# Patient Record
Sex: Male | Born: 1970
Health system: Southern US, Community
[De-identification: ages and names within clinical notes are randomized; demographics above are authoritative.]

## PROBLEM LIST (undated history)

## (undated) DIAGNOSIS — F419 Anxiety disorder, unspecified: Secondary | ICD-10-CM

## (undated) DIAGNOSIS — G43909 Migraine, unspecified, not intractable, without status migrainosus: Secondary | ICD-10-CM

## (undated) DIAGNOSIS — I10 Essential (primary) hypertension: Secondary | ICD-10-CM

## (undated) HISTORY — PX: OTHER SURGICAL HISTORY: SHX169

## (undated) HISTORY — DX: Migraine, unspecified, not intractable, without status migrainosus: G43.909

## (undated) HISTORY — PX: TONSILLECTOMY: SUR1361

## (undated) HISTORY — DX: Essential (primary) hypertension: I10

## (undated) HISTORY — DX: Anxiety disorder, unspecified: F41.9

---

## 1999-12-06 HISTORY — PX: FRACTURE SURGERY: SHX138

## 2007-08-09 ENCOUNTER — Emergency Department: Payer: Self-pay | Admitting: Emergency Medicine

## 2010-03-02 ENCOUNTER — Emergency Department: Payer: Self-pay | Admitting: Emergency Medicine

## 2010-10-01 IMAGING — CT CT HEAD WITHOUT CONTRAST
1 series · 16 of 28 positions shown, 20 images · non-contrast
Comparison: none

REASON FOR EXAM: headache, elevated BP, dizziness
COMMENTS:

[Series 2: soft tissue · axial · 0.39mm/px · z∈[-138,-12]mm · 16 of 28 slices shown, 20 images]
[im 2/28  brain]
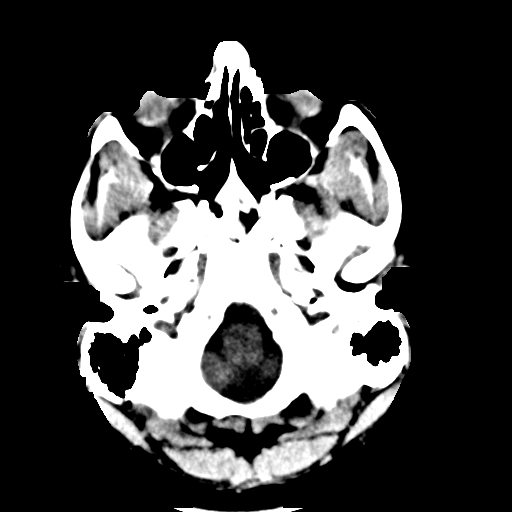
[im 2/28  bone]
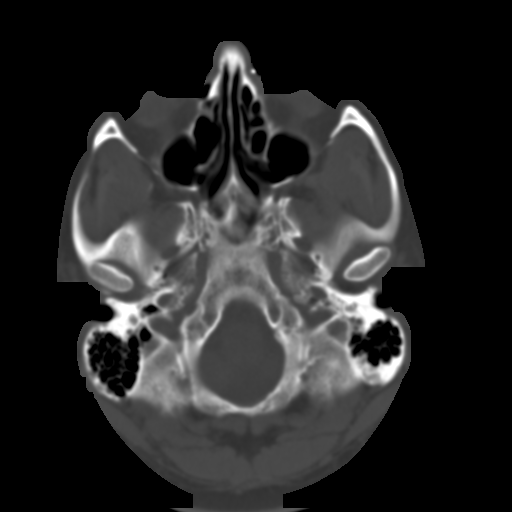
[im 4/28  brain]
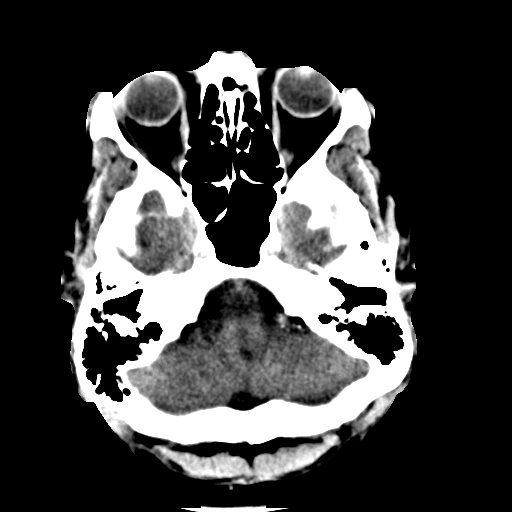
[im 6/28  brain]
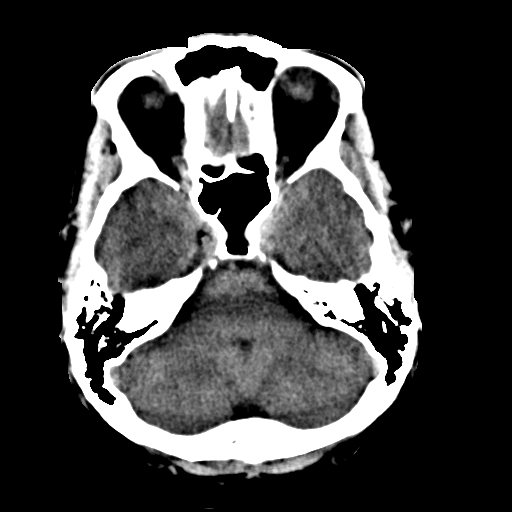
[im 7/28  brain]
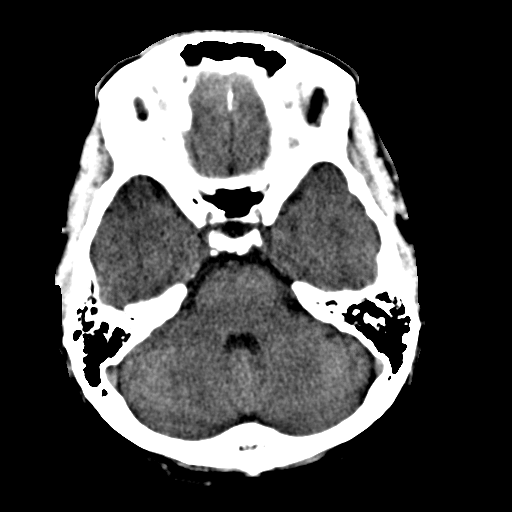
[im 9/28  brain]
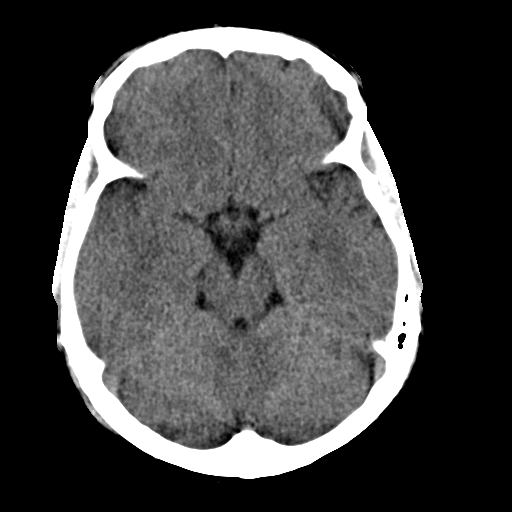
[im 9/28  bone]
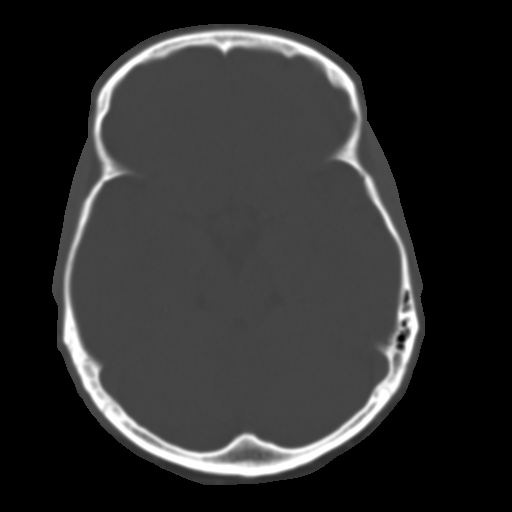
[im 10/28  brain]
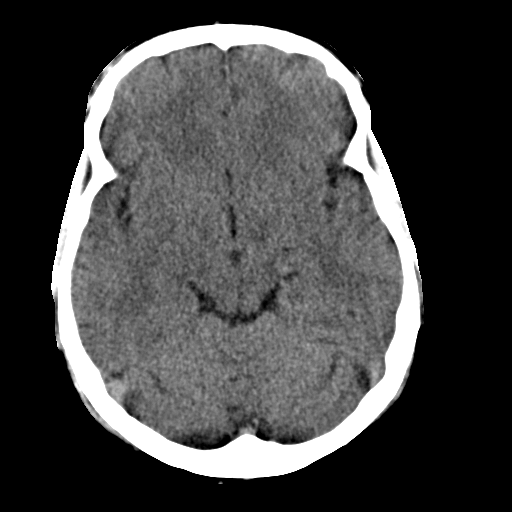
[im 12/28  brain]
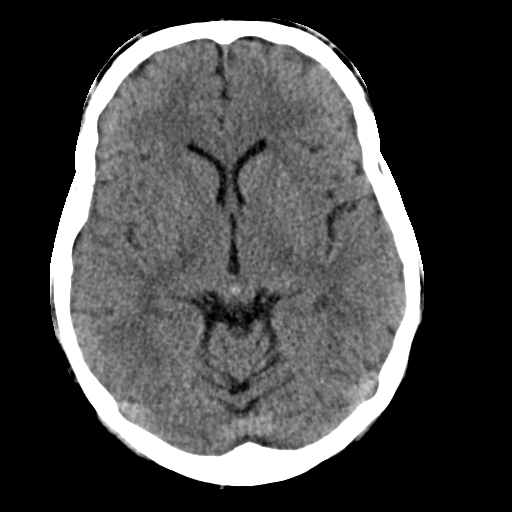
[im 14/28  brain]
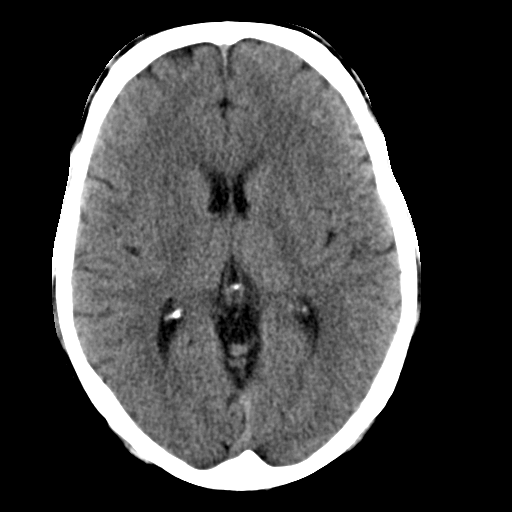
[im 15/28  brain]
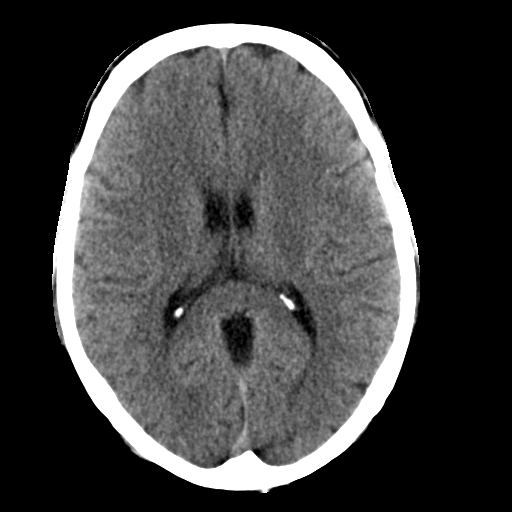
[im 15/28  bone]
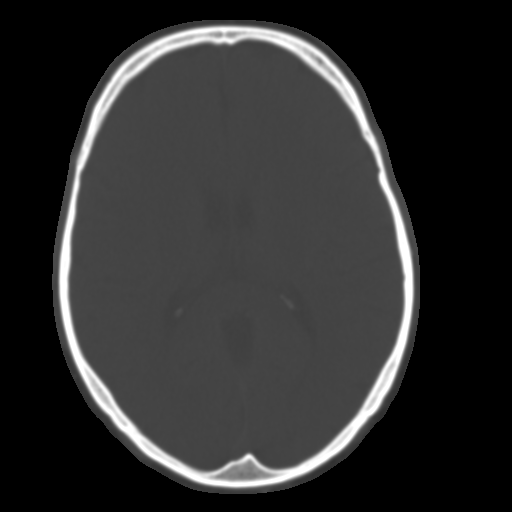
[im 17/28  brain]
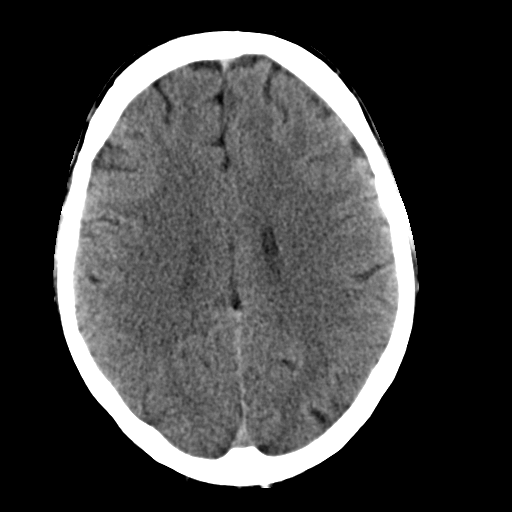
[im 19/28  brain]
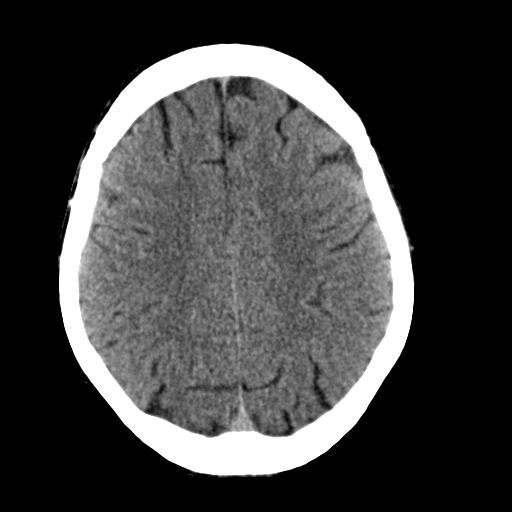
[im 20/28  brain]
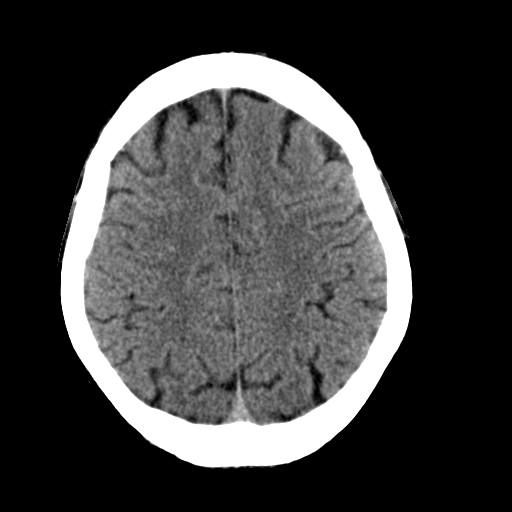
[im 22/28  brain]
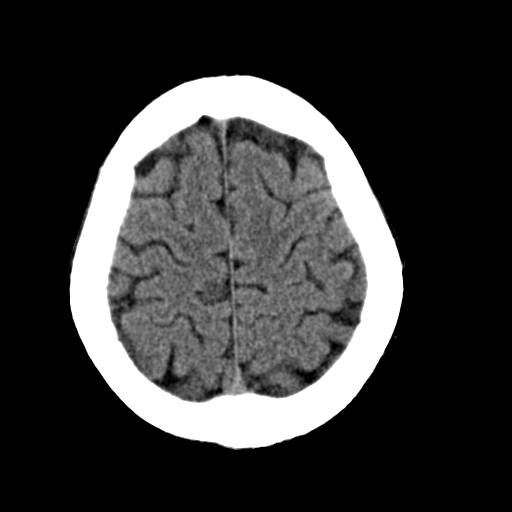
[im 22/28  bone]
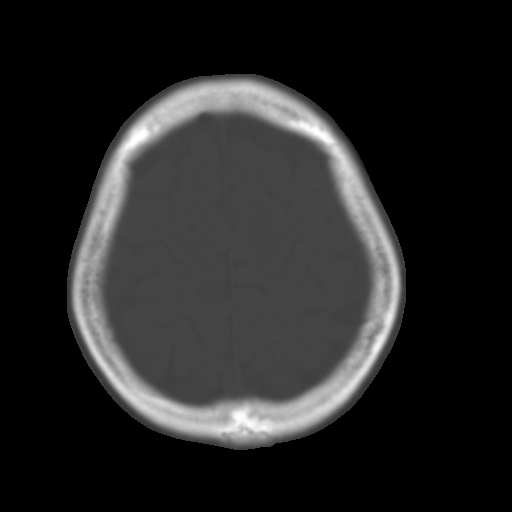
[im 23/28  brain]
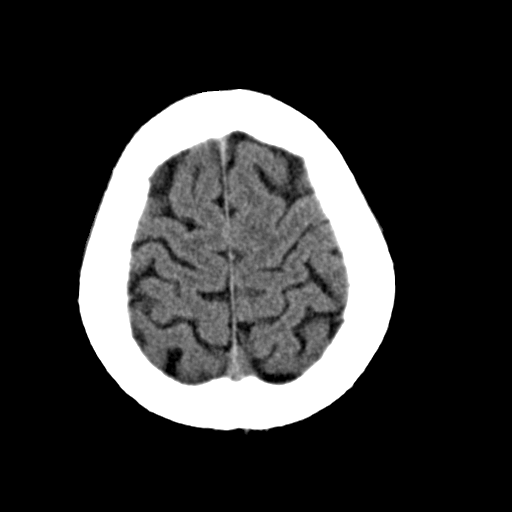
[im 25/28  brain]
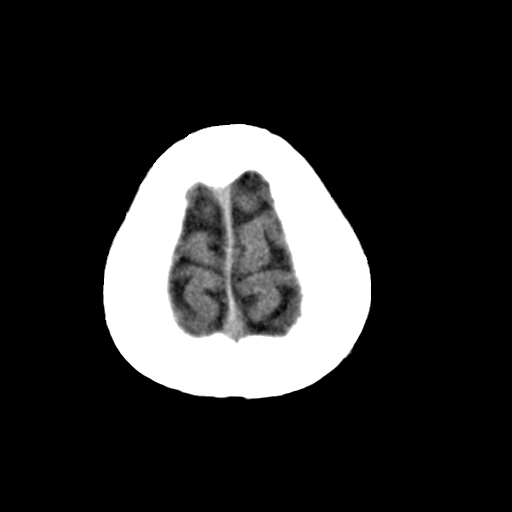
[im 27/28  brain]
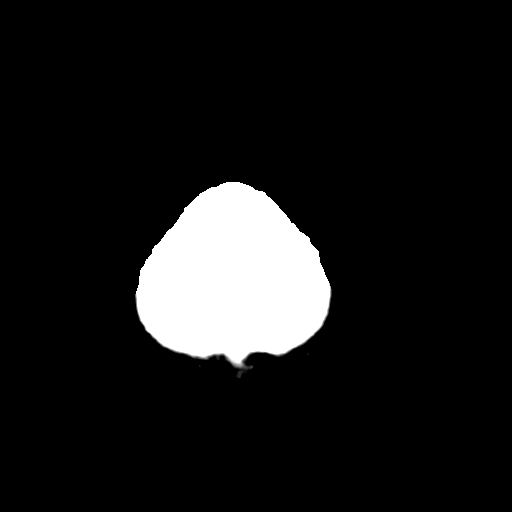

[16 of 28 positions shown; findings below may reference images not displayed]

PROCEDURE:     CT  - CT HEAD WITHOUT CONTRAST  - March 02, 2010  [DATE]

RESULT:     Noncontrast emergent CT of the brain is performed in the
standard fashion. The patient has no previous examination for comparison.

The ventricles and sulci are normal. There is no hemorrhage. There is no
focal mass, mass-effect or midline shift. There is no evidence of edema or
territorial infarct. The bone windows demonstrate normal aeration of the
paranasal sinuses and mastoid air cells. There is no skull fracture
demonstrated.
IMPRESSION: 1. No acute intracranial abnormality.

## 2012-12-12 ENCOUNTER — Emergency Department: Payer: Self-pay | Admitting: Emergency Medicine

## 2013-07-13 IMAGING — CR RIGHT INDEX FINGER 2+V
1 series · 3 of 3 positions shown · non-contrast
Comparison: none

REASON FOR EXAM: cut on grinder wheel
COMMENTS:

PROCEDURE:     DXR - DXR FINGER INDEX 2ND DIGIT RT HA  - December 12, 2012 [DATE]
RESULT:     Comparison:  None

[Series 1: pa · 0.17mm/px · 3 of 3 slices shown]
[im 1/3]
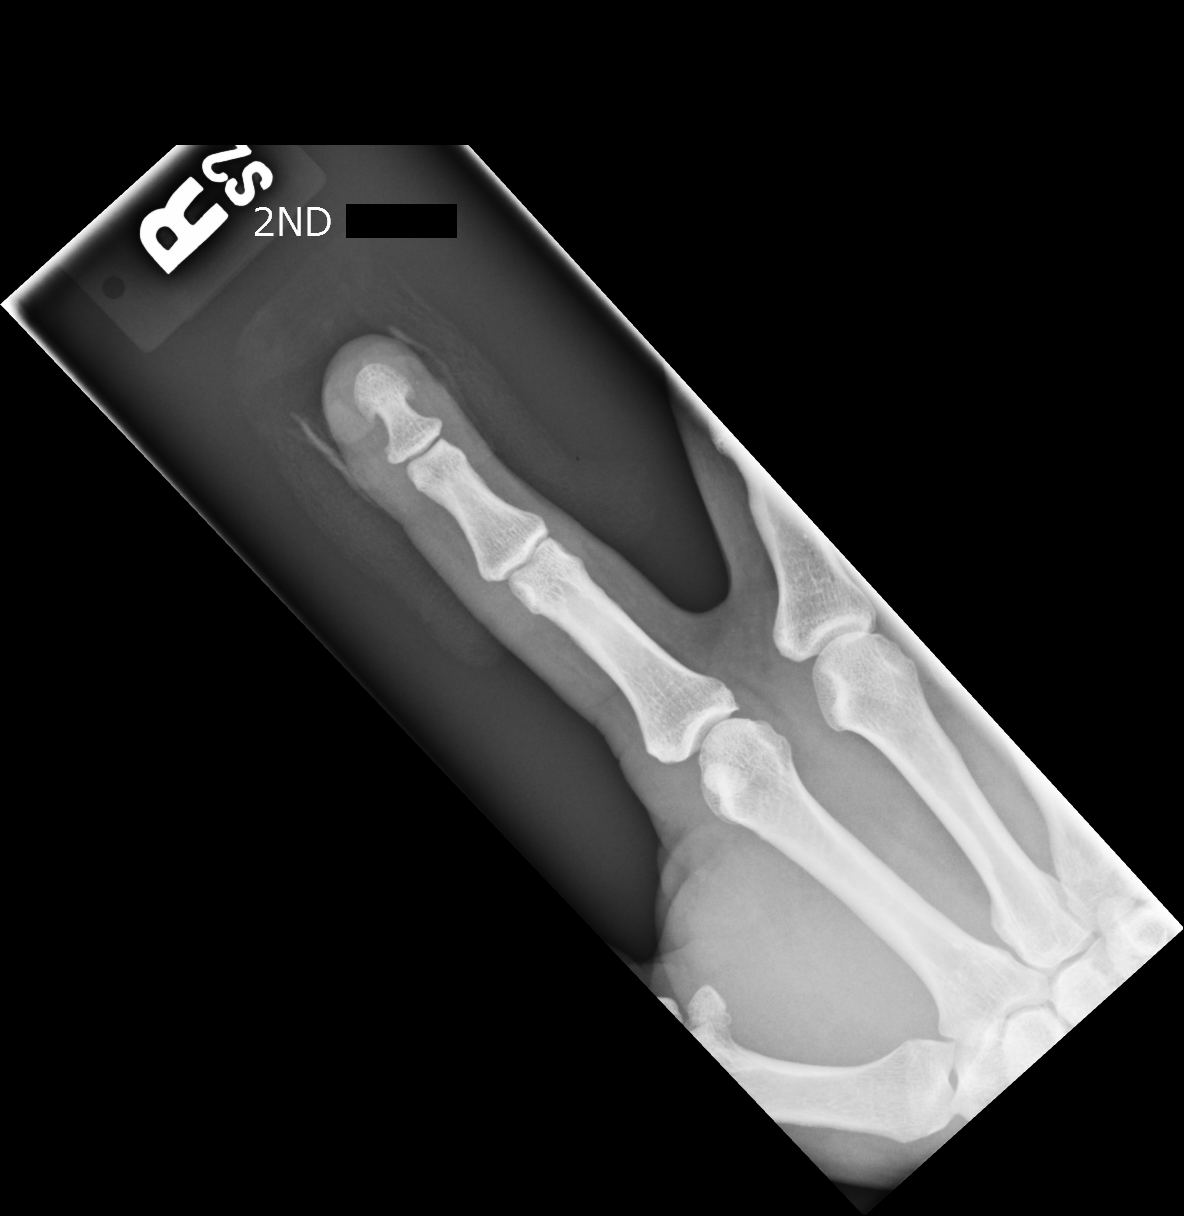
[im 2/3]
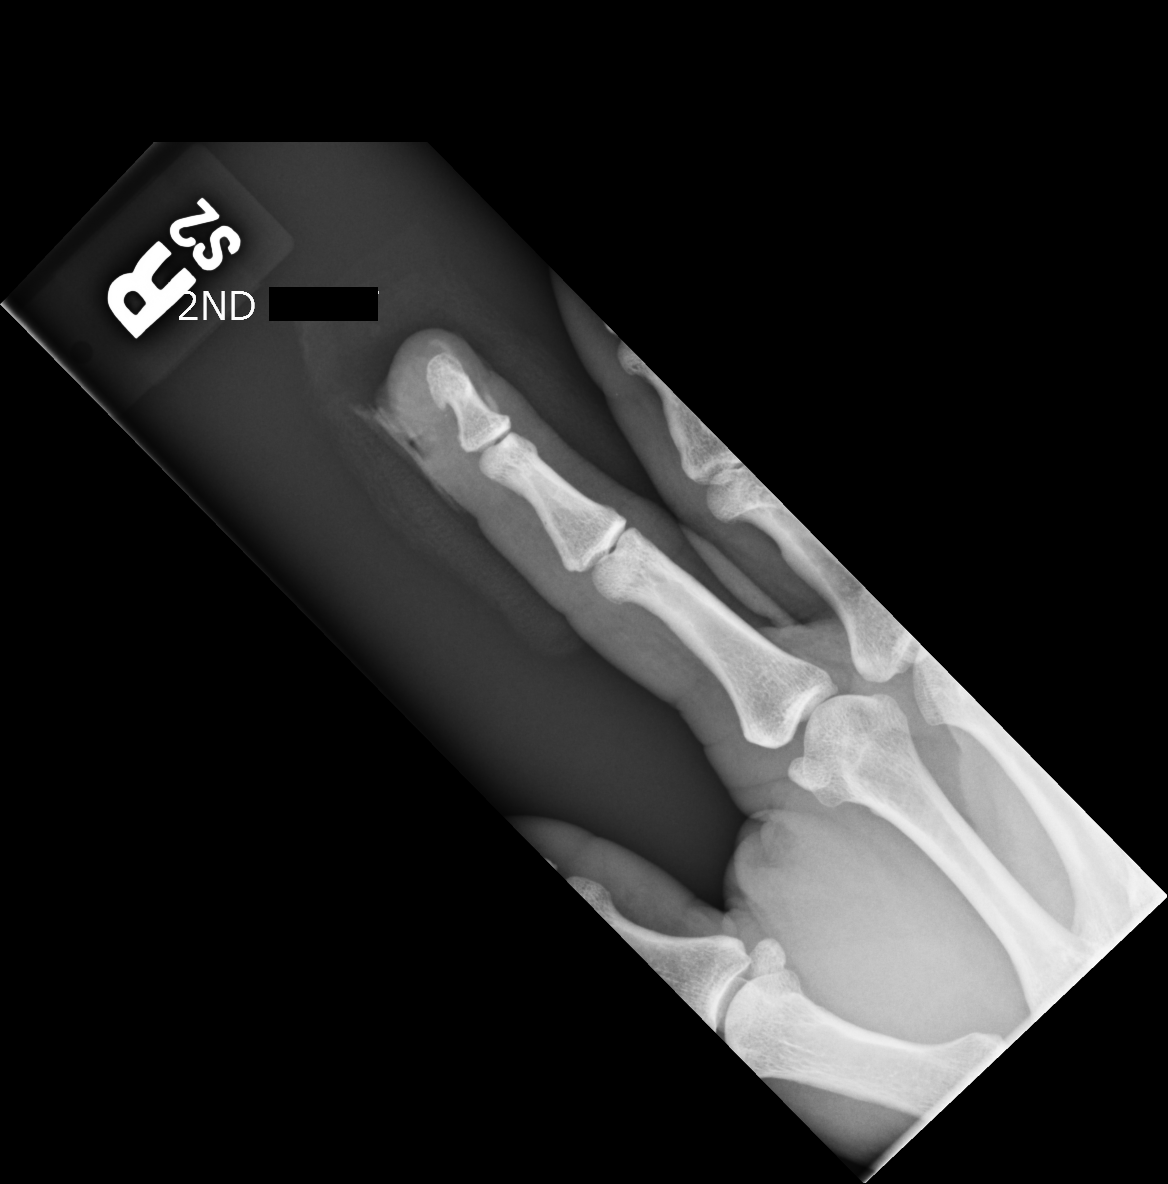
[im 3/3]
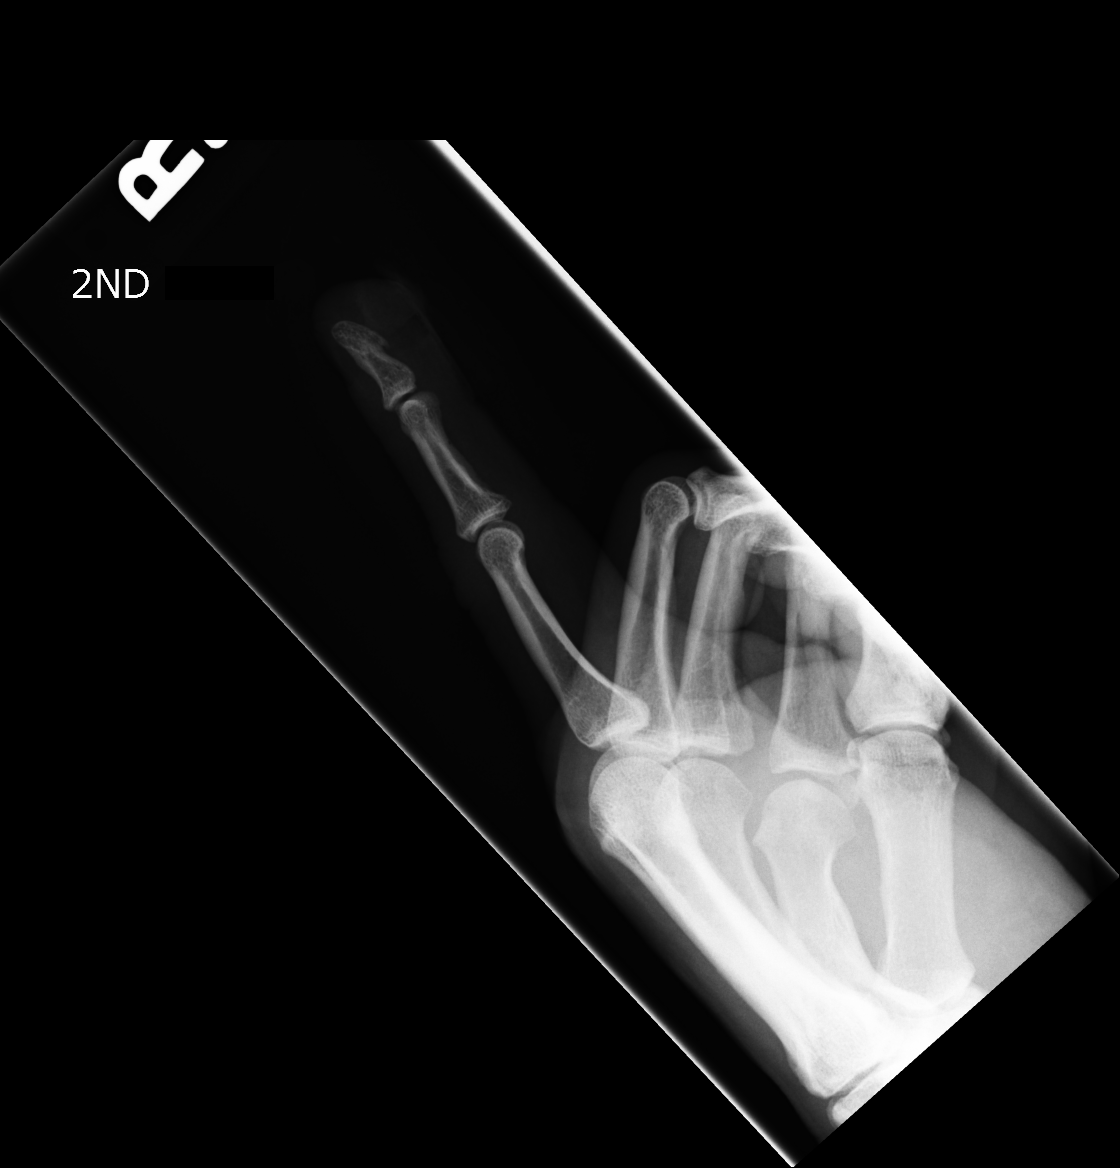

[3 of 3 positions shown; findings below may reference images not displayed]

FINDINGS: Three coned-down views of the right second digit demonstrates no fracture or
dislocation. There is a soft tissue laceration of the distal tip.
IMPRESSION: No acute osseous injury of the right second digit.

[REDACTED]

## 2015-05-19 ENCOUNTER — Ambulatory Visit: Payer: Self-pay | Admitting: Unknown Physician Specialty

## 2015-05-19 ENCOUNTER — Ambulatory Visit (INDEPENDENT_AMBULATORY_CARE_PROVIDER_SITE_OTHER): Payer: BLUE CROSS/BLUE SHIELD | Admitting: Family Medicine

## 2015-05-19 ENCOUNTER — Encounter: Payer: Self-pay | Admitting: Family Medicine

## 2015-05-19 VITALS — BP 142/88 | HR 64 | Temp 98.5°F | Resp 15 | Ht 72.0 in | Wt 223.0 lb

## 2015-05-19 DIAGNOSIS — F419 Anxiety disorder, unspecified: Secondary | ICD-10-CM

## 2015-05-19 DIAGNOSIS — E291 Testicular hypofunction: Secondary | ICD-10-CM

## 2015-05-19 DIAGNOSIS — R5383 Other fatigue: Secondary | ICD-10-CM | POA: Diagnosis not present

## 2015-05-19 DIAGNOSIS — I10 Essential (primary) hypertension: Secondary | ICD-10-CM

## 2015-05-19 NOTE — Assessment & Plan Note (Signed)
The current medical regimen is effective;  continue present plan and medications.  

## 2015-05-19 NOTE — Assessment & Plan Note (Signed)
Discussed may be cause of sx will wate on labs

## 2015-05-19 NOTE — Assessment & Plan Note (Signed)
Discussed will check labs Reviewed last mo labs and normal

## 2015-05-19 NOTE — Assessment & Plan Note (Signed)
Testosterone has seemed to help a little

## 2015-05-19 NOTE — Progress Notes (Signed)
BP 142/88 mmHg  Pulse 64  Temp(Src) 98.5 F (36.9 C) (Oral)  Resp 15  Ht 6' (1.829 m)  Wt 223 lb (101.152 kg)  BMI 30.24 kg/m2  SpO2 97%   Subjective:    Patient ID: Antonio Frank, male    DOB: [birth year], 44 y.o.   MRN: [id number]  HPI: Antonio Frank is a 44 y.o. male  Chief Complaint  Patient presents with  . Fatigue    Onset 1 month/ getting worse  Pt with worsening profound fatigue. This has been ongoing for several mo and just getting worse to being hardly able to work. Still a lot of stress with 2 jobs. A lot of anxiety No depression No tick bite No fever chills No nausea vomiting No cough cold  No change in bowles No arthritis changes No noticed bleeding  Relevant past medical, surgical, family and social history reviewed and updated as indicated. Interim medical history since our last visit reviewed. Allergies and medications reviewed and updated.  Review of Systems  Constitutional:       Feels bad  Eyes: Negative.   Respiratory: Negative.   Cardiovascular: Negative.   Gastrointestinal: Negative.   Endocrine: Negative.   Genitourinary: Negative.   Musculoskeletal: Negative.   Allergic/Immunologic: Negative.   Neurological: Negative.   Hematological: Negative.   Psychiatric/Behavioral: Negative for behavioral problems, confusion, dysphoric mood and agitation. The patient is nervous/anxious.     Per HPI unless specifically indicated above     Objective:    BP 142/88 mmHg  Pulse 64  Temp(Src) 98.5 F (36.9 C) (Oral)  Resp 15  Ht 6' (1.829 m)  Wt 223 lb (101.152 kg)  BMI 30.24 kg/m2  SpO2 97%  Wt Readings from Last 3 Encounters:  05/19/15 223 lb (101.152 kg)  05/19/15 222 lb (100.699 kg)    Physical Exam  Constitutional: He is oriented to person, place, and time. He appears well-developed and well-nourished. No distress.  HENT:  Head: Normocephalic and atraumatic.  Right Ear: Hearing normal.  Left Ear: Hearing normal.  Nose: Nose  normal.  Eyes: Conjunctivae and lids are normal. Right eye exhibits no discharge. Left eye exhibits no discharge. No scleral icterus.  Neck: Normal range of motion. Neck supple. No thyromegaly present.  Cardiovascular: Normal rate and regular rhythm.   Pulmonary/Chest: Effort normal. No respiratory distress.  Abdominal: Bowel sounds are normal.  Musculoskeletal: Normal range of motion. He exhibits no edema.  Neurological: He is alert and oriented to person, place, and time.  Skin: Skin is warm and intact. No rash noted.  Psychiatric: He has a normal mood and affect. His speech is normal and behavior is normal. Judgment and thought content normal. Cognition and memory are normal.    No results found for this or any previous visit.    Assessment & Plan:   Problem List Items Addressed This Visit      Cardiovascular and Mediastinum   Hypertension - Primary    The current medical regimen is effective;  continue present plan and medications.       Relevant Orders   CBC with Differential/Platelet   Vit D  25 hydroxy (rtn osteoporosis monitoring)   Basic metabolic panel   Sed Rate (ESR)   B12     Endocrine   Hypogonadism in male    Testosterone has seemed to help a little      Relevant Orders   CBC with Differential/Platelet   Vit D  25 hydroxy (rtn osteoporosis  monitoring)   Basic metabolic panel   Sed Rate (ESR)   B12     Other   Acute anxiety    Discussed may be cause of sx will wate on labs      Relevant Orders   CBC with Differential/Platelet   Vit D  25 hydroxy (rtn osteoporosis monitoring)   Basic metabolic panel   Sed Rate (ESR)   B12   Fatigue    Discussed will check labs Reviewed last mo labs and normal      Relevant Orders   CBC with Differential/Platelet   Vit D  25 hydroxy (rtn osteoporosis monitoring)   Basic metabolic panel   Sed Rate (ESR)   B12       Follow up plan: No Follow-up on file.

## 2015-05-20 ENCOUNTER — Telehealth: Payer: Self-pay

## 2015-05-20 DIAGNOSIS — R7989 Other specified abnormal findings of blood chemistry: Secondary | ICD-10-CM

## 2015-05-20 LAB — CBC WITH DIFFERENTIAL/PLATELET
BASOS: 1 %
Basophils Absolute: 0.1 10*3/uL (ref 0.0–0.2)
EOS (ABSOLUTE): 0.3 10*3/uL (ref 0.0–0.4)
EOS: 4 %
HEMOGLOBIN: 15.4 g/dL (ref 12.6–17.7)
Hematocrit: 46.3 % (ref 37.5–51.0)
IMMATURE GRANS (ABS): 0 10*3/uL (ref 0.0–0.1)
Immature Granulocytes: 0 %
LYMPHS: 27 %
Lymphocytes Absolute: 2.3 10*3/uL (ref 0.7–3.1)
MCH: 31.3 pg (ref 26.6–33.0)
MCHC: 33.3 g/dL (ref 31.5–35.7)
MCV: 94 fL (ref 79–97)
MONOCYTES: 7 %
Monocytes Absolute: 0.6 10*3/uL (ref 0.1–0.9)
Neutrophils Absolute: 5.2 10*3/uL (ref 1.4–7.0)
Neutrophils: 61 %
Platelets: 346 10*3/uL (ref 150–379)
RBC: 4.92 x10E6/uL (ref 4.14–5.80)
RDW: 12.9 % (ref 12.3–15.4)
WBC: 8.5 10*3/uL (ref 3.4–10.8)

## 2015-05-20 LAB — BASIC METABOLIC PANEL
BUN / CREAT RATIO: 17 (ref 9–20)
BUN: 19 mg/dL (ref 6–24)
CHLORIDE: 100 mmol/L (ref 97–108)
CO2: 22 mmol/L (ref 18–29)
CREATININE: 1.1 mg/dL (ref 0.76–1.27)
Calcium: 9.6 mg/dL (ref 8.7–10.2)
GFR calc Af Amer: 93 mL/min/{1.73_m2} (ref >59)
GFR, EST NON AFRICAN AMERICAN: 81 mL/min/{1.73_m2} (ref >59)
GLUCOSE: 86 mg/dL (ref 65–99)
Potassium: 3.8 mmol/L (ref 3.5–5.2)
Sodium: 139 mmol/L (ref 134–144)

## 2015-05-20 LAB — VITAMIN D 25 HYDROXY (VIT D DEFICIENCY, FRACTURES): Vit D, 25-Hydroxy: 19.2 ng/mL — ABNORMAL LOW (ref 30.0–100.0)

## 2015-05-20 LAB — VITAMIN B12: Vitamin B-12: 290 pg/mL (ref 211–946)

## 2015-05-20 LAB — SEDIMENTATION RATE: Sed Rate: 2 mm/hr (ref 0–15)

## 2015-05-20 MED ORDER — VITAMIN D (ERGOCALCIFEROL) 1.25 MG (50000 UNIT) PO CAPS: 50000.0000 [IU] | ORAL_CAPSULE | ORAL | Status: DC

## 2015-05-20 NOTE — Telephone Encounter (Signed)
-----   Message from Steele Sizer, MD sent at 05/20/2015 12:50 PM EDT ----- Call pt about labs

## 2015-05-20 NOTE — Telephone Encounter (Signed)
Discussed with pt low vit D will start replacement  Call and report if problems

## 2015-07-20 ENCOUNTER — Telehealth: Payer: Self-pay | Admitting: Family Medicine

## 2015-07-20 MED ORDER — CLONAZEPAM 1 MG PO TABS: 1.0000 mg | ORAL_TABLET | Freq: Every day | ORAL | Status: DC | PRN

## 2015-07-20 NOTE — Telephone Encounter (Signed)
Pt called wants to know if [facility] will go ahead and renew his RX for Klonopin. Pharm is [facility] in [place]. Thanks.

## 2015-08-10 ENCOUNTER — Emergency Department (HOSPITAL_BASED_OUTPATIENT_CLINIC_OR_DEPARTMENT_OTHER)
Admission: EM | Admit: 2015-08-10 | Discharge: 2015-08-10 | Disposition: A | Discharge status: Home / Self Care | Payer: BLUE CROSS/BLUE SHIELD | Attending: Emergency Medicine | Admitting: Emergency Medicine

## 2015-08-10 ENCOUNTER — Encounter (HOSPITAL_BASED_OUTPATIENT_CLINIC_OR_DEPARTMENT_OTHER): Payer: Self-pay

## 2015-08-10 DIAGNOSIS — Z23 Encounter for immunization: Secondary | ICD-10-CM | POA: Insufficient documentation

## 2015-08-10 DIAGNOSIS — Y998 Other external cause status: Secondary | ICD-10-CM | POA: Insufficient documentation

## 2015-08-10 DIAGNOSIS — S0181XA Laceration without foreign body of other part of head, initial encounter: Secondary | ICD-10-CM | POA: Insufficient documentation

## 2015-08-10 DIAGNOSIS — I10 Essential (primary) hypertension: Secondary | ICD-10-CM | POA: Diagnosis not present

## 2015-08-10 DIAGNOSIS — W01198A Fall on same level from slipping, tripping and stumbling with subsequent striking against other object, initial encounter: Secondary | ICD-10-CM | POA: Insufficient documentation

## 2015-08-10 DIAGNOSIS — F419 Anxiety disorder, unspecified: Secondary | ICD-10-CM | POA: Insufficient documentation

## 2015-08-10 DIAGNOSIS — Y9389 Activity, other specified: Secondary | ICD-10-CM | POA: Insufficient documentation

## 2015-08-10 DIAGNOSIS — Y9289 Other specified places as the place of occurrence of the external cause: Secondary | ICD-10-CM | POA: Diagnosis not present

## 2015-08-10 MED ORDER — LIDOCAINE-EPINEPHRINE (PF) 2 %-1:200000 IJ SOLN
10.0000 mL | Freq: Once | INTRAMUSCULAR | Status: AC
  Administered 2015-08-10: 10 mL
  Filled 2015-08-10: qty 10

## 2015-08-10 MED ORDER — TETANUS-DIPHTH-ACELL PERTUSSIS 5-2.5-18.5 LF-MCG/0.5 IM SUSP
0.5000 mL | Freq: Once | INTRAMUSCULAR | Status: AC
  Administered 2015-08-10: 0.5 mL via INTRAMUSCULAR
  Filled 2015-08-10: qty 0.5

## 2015-08-10 NOTE — Discharge Instructions (Signed)
Facial Laceration  A facial laceration is a cut on the face. These injuries can be painful and cause bleeding. Lacerations usually heal quickly, but they need special care to reduce scarring. DIAGNOSIS  Your health care provider will take a medical history, ask for details about how the injury occurred, and examine the wound to determine how deep the cut is. TREATMENT  Some facial lacerations may not require closure. Others may not be able to be closed because of an increased risk of infection. The risk of infection and the chance for successful closure will depend on various factors, including the amount of time since the injury occurred. The wound may be cleaned to help prevent infection. If closure is appropriate, pain medicines may be given if needed. Your health care provider will use stitches (sutures), wound glue (adhesive), or skin adhesive strips to repair the laceration. These tools bring the skin edges together to allow for faster healing and a better cosmetic outcome. If needed, you may also be given a tetanus shot. HOME CARE INSTRUCTIONS  Only take over-the-counter or prescription medicines as directed by your health care provider.  Follow your health care provider's instructions for wound care. These instructions will vary depending on the technique used for closing the wound. For Sutures:  Keep the wound clean and dry.   If you were given a bandage (dressing), you should change it at least once a day. Also change the dressing if it becomes wet or dirty, or as directed by your health care provider.   Wash the wound with soap and water 2 times a day. Rinse the wound off with water to remove all soap. Pat the wound dry with a clean towel.   After cleaning, apply a thin layer of the antibiotic ointment recommended by your health care provider. This will help prevent infection and keep the dressing from sticking.   You may shower as usual after the first 24 hours. Do not soak the  wound in water until the sutures are removed.   Get your sutures removed as directed by your health care provider. With facial lacerations, sutures should usually be taken out after 4-5 days to avoid stitch marks.   Wait a few days after your sutures are removed before applying any makeup..  The skin adhesive will usually remain in place for 5-10 days, then naturally fall off the skin. Do not pick at the adhesive film.  After Healing: Once the wound has healed, cover the wound with sunscreen during the day for 1 full year. This can help minimize scarring. Exposure to ultraviolet light in the first year will darken the scar. It can take 1-2 years for the scar to lose its redness and to heal completely.  SEEK IMMEDIATE MEDICAL CARE IF:  You have redness, pain, or swelling around the wound.   You see ayellowish-white fluid (pus) coming from the wound.   You have chills or a fever.  MAKE SURE YOU:  Understand these instructions.  Will watch your condition.  Will get help right away if you are not doing well or get worse. Document Released: 12/29/2004 Document Revised: 09/11/2013 Document Reviewed: 07/04/2013 [facility] Patient Information 2015 [place], [place]. This information is not intended to replace advice given to you by your health care provider. Make sure you discuss any questions you have with your health care provider.

## 2015-08-10 NOTE — ED Notes (Signed)
Cut chin on a rock approx 1.5 hours PTA-lac noted-no bleeding-denies LOC

## 2015-08-10 NOTE — ED Provider Notes (Signed)
CSN: [id number]     Arrival date & time 08/10/15  2025 History   First MD Initiated Contact with Patient 08/10/15 2132     Chief Complaint  Patient presents with  . Facial Laceration     (Consider location/radiation/quality/duration/timing/severity/associated sxs/prior Treatment) HPI Comments: Patient presents today with a laceration of the underside of the chin just prior to arrival.  He reports that he slipped and hit his chin on a rock.  He denies LOC.  Denies any other injuries.  Denies trismus.  He denies any treatment prior to arrival.  Last tetanus in 2007.    The history is provided by the patient.    Past Medical History  Diagnosis Date  . Hypertension   . Anxiety   . Migraines    Past Surgical History  Procedure Laterality Date  . Fracture surgery  2001    Vertebral Fracture with hematoma  . Broken nenck      Age 44  . Tonsillectomy     Family History  Problem Relation Age of Onset  . Hypertension Mother   . Cancer Mother   . Cancer Father     Throat cancer   Social History  Substance Use Topics  . Smoking status: Never Smoker   . Smokeless tobacco: None  . Alcohol Use: 7.2 oz/week    12 Standard drinks or equivalent per week    Review of Systems  All other systems reviewed and are negative.     Allergies  Review of patient's allergies indicates no known allergies.  Home Medications   Prior to Admission medications   Medication Sig Start Date End Date Taking? Authorizing Provider  clonazePAM (KLONOPIN) 1 MG tablet Take 1 tablet (1 mg total) by mouth daily as needed for anxiety. 07/20/15   Steele Sizer, MD  Vitamin D, Ergocalciferol, (DRISDOL) 50000 UNITS CAPS capsule Take 1 capsule (50,000 Units total) by mouth every 7 (seven) days. 05/20/15   Steele Sizer, MD   BP 127/86 mmHg  Pulse 104  Temp(Src) 98.2 F (36.8 C) (Oral)  Resp 20  Ht 6' (1.829 m)  Wt 210 lb (95.255 kg)  BMI 28.47 kg/m2  SpO2 98% Physical Exam  Constitutional: He  appears well-developed and well-nourished.  HENT:  Head: Normocephalic and atraumatic.  Mouth/Throat: No trismus in the jaw.  1 cm linear laceration to the underside of the chin Full ROM of the jaw, no trismus. Teeth intact.  No dental injury. No bony tenderness or swelling of the area  Eyes: Pupils are equal, round, and reactive to light.  Neck: Normal range of motion. Neck supple.  Cardiovascular: Normal rate, regular rhythm and normal heart sounds.   Pulmonary/Chest: Effort normal and breath sounds normal.  Musculoskeletal: Normal range of motion.       Cervical back: He exhibits normal range of motion, no tenderness, no bony tenderness, no swelling, no edema and no deformity.  Neurological: He is alert. He has normal strength. No cranial nerve deficit. Gait normal.  Skin: Skin is warm and dry.  Psychiatric: He has a normal mood and affect.  Nursing note and vitals reviewed.   ED Course  Procedures (including critical care time) Labs Review Labs Reviewed - No data to display  Imaging Review No results found. I have personally reviewed and evaluated these images and lab results as part of my medical decision-making.   EKG Interpretation None     LACERATION REPAIR Performed by: Santiago Glad Authorized by: Santiago Glad Consent: Verbal  consent obtained. Risks and benefits: risks, benefits and alternatives were discussed Consent given by: patient Patient identity confirmed: provided demographic data Prepped and Draped in normal sterile fashion Wound explored  Laceration Location: chin  Laceration Length: 1 cm  No Foreign Bodies seen or palpated  Anesthesia: local infiltration  Local anesthetic: lidocaine 2% with epinephrine  Anesthetic total: 1 ml  Irrigation method: syringe Amount of cleaning: standard  Skin closure: 6-0 Prolene  Number of sutures: 2  Technique: simple interrrupted  Patient tolerance: Patient tolerated the procedure well with no  immediate complications.  MDM   Final diagnoses:  None   Patient presents today with a laceration to the underside of his chin that he sustained while falling on a rock jpta.  Small 1 cm laceration sutured without difficulty.  Able to visualize the full extent of the wound.  No foreign body visualized.  No bony tenderness or swelling of the area.  No trismus of the jaw.  No dental injury.  C-spine non tender with no step offs or deformities.  Full ROM of all extremities.  Therefore, do not feel any imaging is indicated at this time.  Tetanus updated while in the ED.  Patient stable for discharge.  Return precautions given.     Santiago Glad, PA-C 08/12/15 1031  Glynn Octave, MD 08/12/15 [phone]

## 2015-10-27 ENCOUNTER — Encounter: Payer: Self-pay | Admitting: Family Medicine

## 2015-10-27 ENCOUNTER — Ambulatory Visit (INDEPENDENT_AMBULATORY_CARE_PROVIDER_SITE_OTHER): Payer: BLUE CROSS/BLUE SHIELD | Admitting: Family Medicine

## 2015-10-27 VITALS — BP 114/79 | HR 68 | Temp 98.2°F | Ht 70.2 in | Wt 209.0 lb

## 2015-10-27 DIAGNOSIS — F419 Anxiety disorder, unspecified: Secondary | ICD-10-CM | POA: Diagnosis not present

## 2015-10-27 DIAGNOSIS — J329 Chronic sinusitis, unspecified: Secondary | ICD-10-CM | POA: Insufficient documentation

## 2015-10-27 DIAGNOSIS — J019 Acute sinusitis, unspecified: Secondary | ICD-10-CM | POA: Diagnosis not present

## 2015-10-27 MED ORDER — AMOXICILLIN 875 MG PO TABS: 875.0000 mg | ORAL_TABLET | Freq: Two times a day (BID) | ORAL | Status: DC

## 2015-10-27 MED ORDER — CLONAZEPAM 1 MG PO TABS: 1.0000 mg | ORAL_TABLET | Freq: Every day | ORAL | Status: DC | PRN

## 2015-10-27 NOTE — Assessment & Plan Note (Signed)
Discussed sinusitis care and treatment with over-the-counter medications. We'll give patient prescription for amoxicillin Discuss use of Tylenol May use allergy medicine also

## 2015-10-27 NOTE — Assessment & Plan Note (Signed)
Discuss anxiety is now raising 3 grandchildren

## 2015-10-27 NOTE — Progress Notes (Signed)
BP 114/79 mmHg  Pulse 68  Temp(Src) 98.2 F (36.8 C)  Ht 5' 10.2" (1.783 m)  Wt 209 lb (94.802 kg)  BMI 29.82 kg/m2  SpO2 99%   Subjective:    Patient ID: Antonio Frank, male    DOB: 19-Oct-1971, 44 y.o.   MRN: [id number]  HPI: Antonio Frank is a 44 y.o. male  Chief Complaint  Patient presents with  . URI   Patient with marked sinus symptoms congestion feeling bad facial pressure especially with bending over. Symptoms of been ongoing for 2 weeks and getting worse. Patient under a lot of stress has now raising his 3 grandchildren  Relevant past medical, surgical, family and social history reviewed and updated as indicated. Interim medical history since our last visit reviewed. Allergies and medications reviewed and updated.  Review of Systems  Constitutional: Positive for fever, chills and fatigue.  HENT: Positive for congestion, facial swelling, postnasal drip, rhinorrhea, sinus pressure, sneezing and sore throat.   Eyes: Negative.   Respiratory: Positive for cough and shortness of breath.   Cardiovascular: Negative for chest pain, palpitations and leg swelling.  Gastrointestinal: Negative.     Per HPI unless specifically indicated above     Objective:    BP 114/79 mmHg  Pulse 68  Temp(Src) 98.2 F (36.8 C)  Ht 5' 10.2" (1.783 m)  Wt 209 lb (94.802 kg)  BMI 29.82 kg/m2  SpO2 99%  Wt Readings from Last 3 Encounters:  10/27/15 209 lb (94.802 kg)  08/10/15 210 lb (95.255 kg)  05/19/15 223 lb (101.152 kg)    Physical Exam  Constitutional: He is oriented to person, place, and time. He appears well-developed and well-nourished. No distress.  HENT:  Head: Normocephalic and atraumatic.  Right Ear: Hearing and external ear normal.  Left Ear: Hearing and external ear normal.  Nose: Nose normal.  Mouth/Throat: Oropharyngeal exudate present.  Eyes: Conjunctivae and lids are normal. Right eye exhibits no discharge. Left eye exhibits no discharge. No scleral icterus.   Neck: Normal range of motion.  Cardiovascular: Normal rate and normal heart sounds.   Pulmonary/Chest: Effort normal and breath sounds normal. No respiratory distress.  Musculoskeletal: Normal range of motion.  Lymphadenopathy:    He has no cervical adenopathy.  Neurological: He is alert and oriented to person, place, and time.  Skin: Skin is intact. No rash noted.  Psychiatric: He has a normal mood and affect. His speech is normal and behavior is normal. Judgment and thought content normal. Cognition and memory are normal.    Results for orders placed or performed in visit on 05/19/15  CBC with Differential/Platelet  Result Value Ref Range   WBC 8.5 3.4 - 10.8 x10E3/uL   RBC 4.92 4.14 - 5.80 x10E6/uL   Hemoglobin 15.4 12.6 - 17.7 g/dL   Hematocrit 46.3 37.5 - 51.0 %   MCV 94 79 - 97 fL   MCH 31.3 26.6 - 33.0 pg   MCHC 33.3 31.5 - 35.7 g/dL   RDW 12.9 12.3 - 15.4 %   Platelets 346 150 - 379 x10E3/uL   Neutrophils 61 %   Lymphs 27 %   Monocytes 7 %   Eos 4 %   Basos 1 %   Neutrophils Absolute 5.2 1.4 - 7.0 x10E3/uL   Lymphocytes Absolute 2.3 0.7 - 3.1 x10E3/uL   Monocytes Absolute 0.6 0.1 - 0.9 x10E3/uL   EOS (ABSOLUTE) 0.3 0.0 - 0.4 x10E3/uL   Basophils Absolute 0.1 0.0 - 0.2 x10E3/uL  Immature Granulocytes 0 %   Immature Grans (Abs) 0.0 0.0 - 0.1 x10E3/uL  Vit D  25 hydroxy (rtn osteoporosis monitoring)  Result Value Ref Range   Vit D, 25-Hydroxy 19.2 (L) 30.0 - 100.0 ng/mL  Basic metabolic panel  Result Value Ref Range   Glucose 86 65 - 99 mg/dL   BUN 19 6 - 24 mg/dL   Creatinine, Ser 1.10 0.76 - 1.27 mg/dL   GFR calc non Af Amer 81 >59 mL/min/1.73   GFR calc Af Amer 93 >59 mL/min/1.73   BUN/Creatinine Ratio 17 9 - 20   Sodium 139 134 - 144 mmol/L   Potassium 3.8 3.5 - 5.2 mmol/L   Chloride 100 97 - 108 mmol/L   CO2 22 18 - 29 mmol/L   Calcium 9.6 8.7 - 10.2 mg/dL  Sed Rate (ESR)  Result Value Ref Range   Sed Rate 2 0 - 15 mm/hr  B12  Result Value Ref Range    Vitamin B-12 290 211 - 946 pg/mL      Assessment & Plan:   Problem List Items Addressed This Visit      Respiratory   Sinusitis - Primary    Discussed sinusitis care and treatment with over-the-counter medications. We'll give patient prescription for amoxicillin Discuss use of Tylenol May use allergy medicine also      Relevant Medications   amoxicillin (AMOXIL) 875 MG tablet     Other   Acute anxiety    Discuss anxiety is now raising 3 grandchildren           Follow up plan: Return if symptoms worsen or fail to improve, for Physical Exam.

## 2016-07-04 ENCOUNTER — Other Ambulatory Visit: Payer: Self-pay | Admitting: Family Medicine

## 2016-07-04 ENCOUNTER — Telehealth: Payer: Self-pay | Admitting: Family Medicine

## 2016-07-04 MED ORDER — CLONAZEPAM 1 MG PO TABS: 1.0000 mg | ORAL_TABLET | Freq: Every day | ORAL | Status: DC | PRN

## 2016-07-04 NOTE — Telephone Encounter (Signed)
Pt called stated he needs a refill on Klonopin. Pharm is [facility]. Thanks.

## 2016-07-04 NOTE — Telephone Encounter (Signed)
rx

## 2016-07-04 NOTE — Telephone Encounter (Signed)
apt 

## 2016-07-04 NOTE — Telephone Encounter (Signed)
Rx called in to [address] 

## 2016-07-05 NOTE — Telephone Encounter (Signed)
Appt 07/13/16

## 2016-07-13 ENCOUNTER — Ambulatory Visit: Payer: BLUE CROSS/BLUE SHIELD | Admitting: Family Medicine

## 2016-09-29 ENCOUNTER — Other Ambulatory Visit: Payer: Self-pay | Admitting: Family Medicine

## 2016-09-29 NOTE — Telephone Encounter (Signed)
Routing to provider. Next appt. 10/18/16.

## 2016-09-30 NOTE — Telephone Encounter (Signed)
OK to call this in for him

## 2016-10-17 ENCOUNTER — Other Ambulatory Visit: Payer: Self-pay

## 2016-10-18 ENCOUNTER — Ambulatory Visit (INDEPENDENT_AMBULATORY_CARE_PROVIDER_SITE_OTHER): Payer: BLUE CROSS/BLUE SHIELD | Admitting: Family Medicine

## 2016-10-18 ENCOUNTER — Encounter: Payer: Self-pay | Admitting: Family Medicine

## 2016-10-18 DIAGNOSIS — F419 Anxiety disorder, unspecified: Secondary | ICD-10-CM

## 2016-10-18 MED ORDER — CLONAZEPAM 1 MG PO TABS: 1.0000 mg | ORAL_TABLET | Freq: Every day | ORAL | 1 refills | Status: DC | PRN

## 2016-10-18 NOTE — Assessment & Plan Note (Signed)
Discuss anxiety care and treatment limiting use of clonazepam

## 2016-10-18 NOTE — Progress Notes (Signed)
BP 127/85   Pulse 76   Temp 98.2 F (36.8 C)   Wt 210 lb (95.3 kg)   SpO2 98%   BMI 29.96 kg/m    Subjective:    Patient ID: Antonio Frank, male    DOB: [birth year], 45 y.o.   MRN: [id number]  HPI: CADEL STAIRS is a 45 y.o. male  Chief Complaint  Patient presents with  . Anxiety    needs refill on Klonopin   Patient all in all doing well reviewed Klonopin usage has been limited to only uses when necessary for bad days. 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 Relevant past medical, surgical, family and social history reviewed and updated as indicated. Interim medical history since our last visit reviewed. Allergies and medications reviewed and updated.  Review of Systems  Constitutional: Negative.   Respiratory: Negative.   Cardiovascular: Negative.     Per HPI unless specifically indicated above     Objective:    BP 127/85   Pulse 76   Temp 98.2 F (36.8 C)   Wt 210 lb (95.3 kg)   SpO2 98%   BMI 29.96 kg/m   Wt Readings from Last 3 Encounters:  10/18/16 210 lb (95.3 kg)  10/27/15 209 lb (94.8 kg)  08/10/15 210 lb (95.3 kg)    Physical Exam  Constitutional: He is oriented to person, place, and time. He appears well-developed and well-nourished. No distress.  HENT:  Head: Normocephalic and atraumatic.  Right Ear: Hearing normal.  Left Ear: Hearing normal.  Nose: Nose normal.  Eyes: Conjunctivae and lids are normal. Right eye exhibits no discharge. Left eye exhibits no discharge. No scleral icterus.  Cardiovascular: Normal rate, regular rhythm and normal heart sounds.   Pulmonary/Chest: Effort normal and breath sounds normal. No respiratory distress.  Musculoskeletal: Normal range of motion.  Neurological: He is alert and oriented to person, place, and time.  Skin: Skin is intact. No rash noted.  Psychiatric: He has a  normal mood and affect. His speech is normal and behavior is normal. Judgment and thought content normal. Cognition and memory are normal.    Results for orders placed or performed in visit on 05/19/15  CBC with Differential/Platelet  Result Value Ref Range   WBC 8.5 3.4 - 10.8 x10E3/uL   RBC 4.92 4.14 - 5.80 x10E6/uL   Hemoglobin 15.4 12.6 - 17.7 g/dL   Hematocrit 46.3 37.5 - 51.0 %   MCV 94 79 - 97 fL   MCH 31.3 26.6 - 33.0 pg   MCHC 33.3 31.5 - 35.7 g/dL   RDW 12.9 12.3 - 15.4 %   Platelets 346 150 - 379 x10E3/uL   Neutrophils 61 %   Lymphs 27 %   Monocytes 7 %   Eos 4 %   Basos 1 %   Neutrophils Absolute 5.2 1.4 - 7.0 x10E3/uL   Lymphocytes Absolute 2.3 0.7 - 3.1 x10E3/uL   Monocytes Absolute 0.6 0.1 - 0.9 x10E3/uL   EOS (ABSOLUTE) 0.3 0.0 - 0.4 x10E3/uL   Basophils Absolute 0.1 0.0 - 0.2 x10E3/uL   Immature Granulocytes 0 %   Immature Grans (Abs) 0.0 0.0 - 0.1 x10E3/uL  Vit D  25 hydroxy (rtn osteoporosis monitoring)  Result Value Ref Range   Vit D, 25-Hydroxy 19.2 (L) 30.0 - 100.0 ng/mL  Basic metabolic panel  Result Value Ref Range  Glucose 86 65 - 99 mg/dL   BUN 19 6 - 24 mg/dL   Creatinine, Ser 1.10 0.76 - 1.27 mg/dL   GFR calc non Af Amer 81 >59 mL/min/1.73   GFR calc Af Amer 93 >59 mL/min/1.73   BUN/Creatinine Ratio 17 9 - 20   Sodium 139 134 - 144 mmol/L   Potassium 3.8 3.5 - 5.2 mmol/L   Chloride 100 97 - 108 mmol/L   CO2 22 18 - 29 mmol/L   Calcium 9.6 8.7 - 10.2 mg/dL  Sed Rate (ESR)  Result Value Ref Range   Sed Rate 2 0 - 15 mm/hr  B12  Result Value Ref Range   Vitamin B-12 290 211 - 946 pg/mL      Assessment & Plan:   Problem List Items Addressed This Visit      Other   Acute anxiety    Discuss anxiety care and treatment limiting use of clonazepam          Follow up plan: Return for Physical Exam this winter.

## 2016-12-08 ENCOUNTER — Encounter: Payer: BLUE CROSS/BLUE SHIELD | Admitting: Family Medicine

## 2016-12-15 ENCOUNTER — Encounter: Payer: BLUE CROSS/BLUE SHIELD | Admitting: Family Medicine

## 2017-04-12 ENCOUNTER — Other Ambulatory Visit: Payer: Self-pay | Admitting: Family Medicine

## 2017-04-12 MED ORDER — CLONAZEPAM 1 MG PO TABS: 1.0000 mg | ORAL_TABLET | Freq: Every day | ORAL | 0 refills | Status: DC | PRN

## 2017-04-12 NOTE — Telephone Encounter (Signed)
Patient called in regards to needing a refill for his prescription for clonazePAM.   Please Advise.  Patient Contact: [phone]  Thank you.

## 2017-04-12 NOTE — Telephone Encounter (Signed)
Patient stated that he also would like for the medication to be sent to [facility] in [place].  Thank you

## 2017-04-12 NOTE — Telephone Encounter (Signed)
apt 

## 2017-04-12 NOTE — Telephone Encounter (Signed)
Pt last seen in November. Requested he return for f/u CPE during last winter. Pt no showed recent OV. Please advise.

## 2017-04-28 NOTE — Telephone Encounter (Signed)
Pt stated he will call back at a later time to reschedule.

## 2017-12-15 ENCOUNTER — Telehealth: Payer: Self-pay | Admitting: Family Medicine

## 2017-12-15 NOTE — Telephone Encounter (Signed)
Copied from CRM [id number]. Topic: General - Other >> Dec 15, 2017  3:53 PM Raquel SarnaHayes, Teresa G wrote: Clonazepam  Pt is needing the Rx filled asap.  Pt has been without it for a while. [facility] [phone]- [phone]                                                                                                                                                               Phone: [phone]

## 2017-12-15 NOTE — Telephone Encounter (Signed)
Clonazepam refill. Last refill 04/12/17.

## 2017-12-18 MED ORDER — CLONAZEPAM 1 MG PO TABS: 1.0000 mg | ORAL_TABLET | Freq: Every day | ORAL | 0 refills | Status: DC | PRN

## 2017-12-18 NOTE — Telephone Encounter (Signed)
Spoke with pt and let him know that 10 pills were called and in and he said that should be enough to get him through 01/04/18.

## 2017-12-18 NOTE — Telephone Encounter (Signed)
Needs apt over a year from last visit

## 2018-01-04 ENCOUNTER — Encounter: Payer: Self-pay | Admitting: Family Medicine

## 2018-01-04 ENCOUNTER — Ambulatory Visit (INDEPENDENT_AMBULATORY_CARE_PROVIDER_SITE_OTHER): Payer: BLUE CROSS/BLUE SHIELD | Admitting: Family Medicine

## 2018-01-04 VITALS — BP 128/80 | HR 90 | Ht 70.08 in | Wt 207.0 lb

## 2018-01-04 DIAGNOSIS — Z13220 Encounter for screening for lipoid disorders: Secondary | ICD-10-CM | POA: Diagnosis not present

## 2018-01-04 DIAGNOSIS — Z114 Encounter for screening for human immunodeficiency virus [HIV]: Secondary | ICD-10-CM

## 2018-01-04 DIAGNOSIS — Z1329 Encounter for screening for other suspected endocrine disorder: Secondary | ICD-10-CM

## 2018-01-04 DIAGNOSIS — I10 Essential (primary) hypertension: Secondary | ICD-10-CM | POA: Diagnosis not present

## 2018-01-04 DIAGNOSIS — Z0000 Encounter for general adult medical examination without abnormal findings: Secondary | ICD-10-CM

## 2018-01-04 DIAGNOSIS — F419 Anxiety disorder, unspecified: Secondary | ICD-10-CM

## 2018-01-04 DIAGNOSIS — E291 Testicular hypofunction: Secondary | ICD-10-CM | POA: Diagnosis not present

## 2018-01-04 LAB — URINALYSIS, ROUTINE W REFLEX MICROSCOPIC
BILIRUBIN UA: NEGATIVE
GLUCOSE, UA: NEGATIVE
Leukocytes, UA: NEGATIVE
Nitrite, UA: NEGATIVE
PH UA: 5.5 (ref 5.0–7.5)
PROTEIN UA: NEGATIVE
RBC, UA: NEGATIVE
Specific Gravity, UA: 1.01 (ref 1.005–1.030)
Urobilinogen, Ur: 0.2 mg/dL (ref 0.2–1.0)

## 2018-01-04 MED ORDER — CLONAZEPAM 1 MG PO TABS: 1.0000 mg | ORAL_TABLET | Freq: Every day | ORAL | 0 refills | Status: DC | PRN

## 2018-01-04 NOTE — Assessment & Plan Note (Signed)
No tk for now

## 2018-01-04 NOTE — Assessment & Plan Note (Signed)
No treatment for now

## 2018-01-04 NOTE — Progress Notes (Signed)
BP 128/80 (BP Location: Left Arm)   Pulse 90   Ht 5' 10.08" (1.78 m)   Wt 207 lb (93.9 kg)   SpO2 97%   BMI 29.63 kg/m    Subjective:    Patient ID: Antonio Frank, male    DOB: 1971/07/18, 47 y.o.   MRN: [id number]  HPI: Antonio Frank is a 47 y.o. male  Chief Complaint  Patient presents with  . Annual Exam   Patient all in all doing well takes rare clonazepam for anxiety.  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 Patient otherwise doing well no complaints. Had 8 cups of coffee today.  Relevant past medical, surgical, family and social history reviewed and updated as indicated. Interim medical history since our last visit reviewed. Allergies and medications reviewed and updated.  Review of Systems  Constitutional: Negative.   HENT: Negative.   Eyes: Negative.   Respiratory: Negative.   Cardiovascular: Negative.   Gastrointestinal: Negative.   Endocrine: Negative.   Genitourinary: Negative.   Musculoskeletal: Negative.   Skin: Negative.   Allergic/Immunologic: Negative.   Neurological: Negative.   Hematological: Negative.   Psychiatric/Behavioral: Negative.     Per HPI unless specifically indicated above     Objective:    BP 128/80 (BP Location: Left Arm)   Pulse 90   Ht 5' 10.08" (1.78 m)   Wt 207 lb (93.9 kg)   SpO2 97%   BMI 29.63 kg/m   Wt Readings from Last 3 Encounters:  01/04/18 207 lb (93.9 kg)  10/18/16 210 lb (95.3 kg)  10/27/15 209 lb (94.8 kg)    Physical Exam  Constitutional: He is oriented to person, place, and time. He appears well-developed and well-nourished.  HENT:  Head: Normocephalic and atraumatic.  Right Ear: External ear normal.  Left Ear: External ear normal.  Eyes: Conjunctivae and EOM are normal. Pupils are equal, round, and reactive to light.  Neck: Normal range of motion. Neck supple.  Cardiovascular: Normal rate, regular rhythm, normal heart sounds  and intact distal pulses.  Pulmonary/Chest: Effort normal and breath sounds normal.  Abdominal: Soft. Bowel sounds are normal. There is no splenomegaly or hepatomegaly.  Genitourinary: Rectum normal, prostate normal and penis normal.  Musculoskeletal: Normal range of motion.  Neurological: He is alert and oriented to person, place, and time. He has normal reflexes.  Skin: No rash noted. No erythema.  Psychiatric: He has a normal mood and affect. His behavior is normal. Judgment and thought content normal.    Results for orders placed or performed in visit on 05/19/15  CBC with Differential/Platelet  Result Value Ref Range   WBC 8.5 3.4 - 10.8 x10E3/uL   RBC 4.92 4.14 - 5.80 x10E6/uL   Hemoglobin 15.4 12.6 - 17.7 g/dL   Hematocrit 46.3 37.5 - 51.0 %   MCV 94 79 - 97 fL   MCH 31.3 26.6 - 33.0 pg   MCHC 33.3 31.5 - 35.7 g/dL   RDW 12.9 12.3 - 15.4 %   Platelets 346 150 - 379 x10E3/uL   Neutrophils 61 %   Lymphs 27 %   Monocytes 7 %   Eos 4 %   Basos 1 %   Neutrophils Absolute 5.2 1.4 - 7.0 x10E3/uL   Lymphocytes Absolute 2.3 0.7 - 3.1 x10E3/uL   Monocytes Absolute 0.6 0.1 - 0.9 x10E3/uL   EOS (ABSOLUTE) 0.3 0.0 - 0.4 x10E3/uL   Basophils Absolute  0.1 0.0 - 0.2 x10E3/uL   Immature Granulocytes 0 %   Immature Grans (Abs) 0.0 0.0 - 0.1 x10E3/uL  Vit D  25 hydroxy (rtn osteoporosis monitoring)  Result Value Ref Range   Vit D, 25-Hydroxy 19.2 (L) 30.0 - 100.0 ng/mL  Basic metabolic panel  Result Value Ref Range   Glucose 86 65 - 99 mg/dL   BUN 19 6 - 24 mg/dL   Creatinine, Ser 1.10 0.76 - 1.27 mg/dL   GFR calc non Af Amer 81 >59 mL/min/1.73   GFR calc Af Amer 93 >59 mL/min/1.73   BUN/Creatinine Ratio 17 9 - 20   Sodium 139 134 - 144 mmol/L   Potassium 3.8 3.5 - 5.2 mmol/L   Chloride 100 97 - 108 mmol/L   CO2 22 18 - 29 mmol/L   Calcium 9.6 8.7 - 10.2 mg/dL  Sed Rate (ESR)  Result Value Ref Range   Sed Rate 2 0 - 15 mm/hr  B12  Result Value Ref Range   Vitamin B-12 290  211 - 946 pg/mL      Assessment & Plan:   Problem List Items Addressed This Visit      Cardiovascular and Mediastinum   Hypertension - Primary    No tk for now      Relevant Orders   CBC with Differential/Platelet   Comprehensive metabolic panel   Urinalysis, Routine w reflex microscopic     Endocrine   Hypogonadism in male    No treatment for now      Relevant Orders   PSA     Other   Acute anxiety    Discuss anxiety care and tx  caffeine makes worse. Limited use of klonopen        Other Visit Diagnoses    Screening cholesterol level       Relevant Orders   Lipid panel   Thyroid disorder screen       Relevant Orders   TSH   Encounter for screening for HIV       Relevant Orders   HIV antibody (with reflex)   PE (physical exam), annual       Relevant Orders   HIV antibody (with reflex)       Follow up plan: Return in about 1 year (around 01/04/2019), or if symptoms worsen or fail to improve.

## 2018-01-04 NOTE — Assessment & Plan Note (Signed)
Discuss anxiety care and tx  caffeine makes worse. Limited use of klonopen

## 2018-01-05 LAB — COMPREHENSIVE METABOLIC PANEL
A/G RATIO: 2.3 — AB (ref 1.2–2.2)
ALBUMIN: 5 g/dL (ref 3.5–5.5)
ALT: 17 IU/L (ref 0–44)
AST: 15 IU/L (ref 0–40)
Alkaline Phosphatase: 58 IU/L (ref 39–117)
BILIRUBIN TOTAL: 1.1 mg/dL (ref 0.0–1.2)
BUN / CREAT RATIO: 11 (ref 9–20)
BUN: 13 mg/dL (ref 6–24)
CHLORIDE: 102 mmol/L (ref 96–106)
CO2: 22 mmol/L (ref 20–29)
Calcium: 10 mg/dL (ref 8.7–10.2)
Creatinine, Ser: 1.14 mg/dL (ref 0.76–1.27)
GFR calc non Af Amer: 76 mL/min/{1.73_m2} (ref >59)
GFR, EST AFRICAN AMERICAN: 88 mL/min/{1.73_m2} (ref >59)
Globulin, Total: 2.2 g/dL (ref 1.5–4.5)
Glucose: 88 mg/dL (ref 65–99)
POTASSIUM: 4.4 mmol/L (ref 3.5–5.2)
SODIUM: 142 mmol/L (ref 134–144)
TOTAL PROTEIN: 7.2 g/dL (ref 6.0–8.5)

## 2018-01-05 LAB — CBC WITH DIFFERENTIAL/PLATELET
Basophils Absolute: 0 10*3/uL (ref 0.0–0.2)
Basos: 1 %
EOS (ABSOLUTE): 0.1 10*3/uL (ref 0.0–0.4)
Eos: 2 %
HEMOGLOBIN: 15.8 g/dL (ref 13.0–17.7)
Hematocrit: 45.6 % (ref 37.5–51.0)
IMMATURE GRANS (ABS): 0 10*3/uL (ref 0.0–0.1)
Immature Granulocytes: 0 %
LYMPHS ABS: 1.4 10*3/uL (ref 0.7–3.1)
LYMPHS: 21 %
MCH: 31.9 pg (ref 26.6–33.0)
MCHC: 34.6 g/dL (ref 31.5–35.7)
MCV: 92 fL (ref 79–97)
MONOCYTES: 8 %
Monocytes Absolute: 0.5 10*3/uL (ref 0.1–0.9)
NEUTROS ABS: 4.3 10*3/uL (ref 1.4–7.0)
Neutrophils: 68 %
Platelets: 348 10*3/uL (ref 150–379)
RBC: 4.96 x10E6/uL (ref 4.14–5.80)
RDW: 12.9 % (ref 12.3–15.4)
WBC: 6.4 10*3/uL (ref 3.4–10.8)

## 2018-01-05 LAB — LIPID PANEL
Chol/HDL Ratio: 2.4 ratio (ref 0.0–5.0)
Cholesterol, Total: 186 mg/dL (ref 100–199)
HDL: 77 mg/dL (ref >39)
LDL Calculated: 93 mg/dL (ref 0–99)
Triglycerides: 81 mg/dL (ref 0–149)
VLDL CHOLESTEROL CAL: 16 mg/dL (ref 5–40)

## 2018-01-05 LAB — PSA: PROSTATE SPECIFIC AG, SERUM: 0.5 ng/mL (ref 0.0–4.0)

## 2018-01-05 LAB — HIV ANTIBODY (ROUTINE TESTING W REFLEX): HIV SCREEN 4TH GENERATION: NONREACTIVE

## 2018-01-05 LAB — TSH: TSH: 1.2 u[IU]/mL (ref 0.450–4.500)

## 2018-01-08 ENCOUNTER — Encounter: Payer: Self-pay | Admitting: Family Medicine

## 2018-05-22 ENCOUNTER — Telehealth: Payer: Self-pay | Admitting: Family Medicine

## 2018-05-24 ENCOUNTER — Other Ambulatory Visit: Payer: Self-pay | Admitting: Family Medicine

## 2018-05-24 MED ORDER — CLONAZEPAM 1 MG PO TABS: 1.0000 mg | ORAL_TABLET | Freq: Every day | ORAL | 0 refills | Status: DC | PRN

## 2018-05-24 NOTE — Telephone Encounter (Signed)
Interface request for refill on Klonopin 1 mg LOV 01/04/18 with Dr.  Dossie Arbourrissman   Last refill 01/04/18   [facility] [address]

## 2018-05-24 NOTE — Telephone Encounter (Signed)
Done

## 2018-09-11 ENCOUNTER — Other Ambulatory Visit: Payer: Self-pay | Admitting: Family Medicine

## 2018-09-11 NOTE — Telephone Encounter (Signed)
Requested medication (s) are due for refill today:   Yes  Requested medication (s) are on the active medication list:   Yes  Future visit scheduled:   No   Last ordered: 05/24/18   #30   0 refills   Requested Prescriptions  Pending Prescriptions Disp Refills   clonazePAM (KLONOPIN) 1 MG tablet [Pharmacy Med Name: CLONAZEPAM 1 MG TABLET] 30 tablet 0    Sig: Take 1 tablet (1 mg total) by mouth daily as needed for anxiety.     Not Delegated - Psychiatry:  Anxiolytics/Hypnotics Failed - 09/11/2018 10:18 AM      Failed - This refill cannot be delegated      Failed - Urine Drug Screen completed in last 360 days.      Failed - Valid encounter within last 6 months    Recent Outpatient Visits          8 months ago Essential hypertension   [facility] Crissman, Redge Gainer, MD   1 year ago Acute anxiety   [facility] Crissman, Redge Gainer, MD   2 years ago Acute sinusitis, recurrence not specified, unspecified location   [facility] Crissman, Redge Gainer, MD   3 years ago Essential hypertension   [facility] Crissman, Redge Gainer, MD

## 2018-09-12 MED ORDER — CLONAZEPAM 1 MG PO TABS: 1.0000 mg | ORAL_TABLET | Freq: Every day | ORAL | 0 refills | Status: DC | PRN

## 2018-09-12 NOTE — Addendum Note (Signed)
Addended by: Dorcas Carrow on: 09/12/2018 11:02 AM   Modules accepted: Orders

## 2018-09-12 NOTE — Telephone Encounter (Signed)
Pt called and scheduled appt for 09/27/18 with Dr. Dossie Arbour - pt is out of clonazepam - please send in medication until appt date  [facility]

## 2018-09-27 ENCOUNTER — Ambulatory Visit: Payer: BLUE CROSS/BLUE SHIELD | Admitting: Family Medicine

## 2018-09-27 ENCOUNTER — Encounter: Payer: Self-pay | Admitting: Family Medicine

## 2018-09-27 DIAGNOSIS — F419 Anxiety disorder, unspecified: Secondary | ICD-10-CM | POA: Diagnosis not present

## 2018-09-27 MED ORDER — CLONAZEPAM 1 MG PO TABS: 1.0000 mg | ORAL_TABLET | Freq: Every day | ORAL | 1 refills | Status: DC | PRN

## 2018-09-27 NOTE — Progress Notes (Signed)
BP 136/88 (BP Location: Left Arm)   Pulse 73   Wt 214 lb 8 oz (97.3 kg)   SpO2 99%   BMI 30.71 kg/m    Subjective:    Patient ID: Antonio Frank, male    DOB: Jun 22, 1971, 47 y.o.   MRN: [id number]  HPI: LEDARIUS Frank is a 47 y.o. male  Chief Complaint  Patient presents with  . Anxiety  Discussed patient in aviation issues patient currently not flying and probably will stop flying. Discussed anxiety nerves patient takes occasional clonazepam most recently took clonazepam 1 woke up short of breath.  On review no sleep apnea type symptoms. Patient does not take clonazepam on a regular basis. Reviewed option for daily medications such as antidepressants patient very reluctant has had a bad experience with Paxil.  Relevant past medical, surgical, family and social history reviewed and updated as indicated. Interim medical history since our last visit reviewed. Allergies and medications reviewed and updated.  Review of Systems  Constitutional: Negative.   Respiratory: Negative.   Cardiovascular: Negative.     Per HPI unless specifically indicated above     Objective:    BP 136/88 (BP Location: Left Arm)   Pulse 73   Wt 214 lb 8 oz (97.3 kg)   SpO2 99%   BMI 30.71 kg/m   Wt Readings from Last 3 Encounters:  09/27/18 214 lb 8 oz (97.3 kg)  01/04/18 207 lb (93.9 kg)  10/18/16 210 lb (95.3 kg)    Physical Exam  Constitutional: He is oriented to person, place, and time. He appears well-developed and well-nourished.  HENT:  Head: Normocephalic and atraumatic.  Eyes: Conjunctivae and EOM are normal.  Neck: Normal range of motion.  Cardiovascular: Normal rate, regular rhythm and normal heart sounds.  Pulmonary/Chest: Effort normal and breath sounds normal.  Musculoskeletal: Normal range of motion.  Neurological: He is alert and oriented to person, place, and time.  Skin: No erythema.  Psychiatric: He has a normal mood and affect. His behavior is normal. Judgment and  thought content normal.    Results for orders placed or performed in visit on 01/04/18  CBC with Differential/Platelet  Result Value Ref Range   WBC 6.4 3.4 - 10.8 x10E3/uL   RBC 4.96 4.14 - 5.80 x10E6/uL   Hemoglobin 15.8 13.0 - 17.7 g/dL   Hematocrit 40.9 81.1 - 51.0 %   MCV 92 79 - 97 fL   MCH 31.9 26.6 - 33.0 pg   MCHC 34.6 31.5 - 35.7 g/dL   RDW 91.4 78.2 - 95.6 %   Platelets 348 150 - 379 x10E3/uL   Neutrophils 68 Not Estab. %   Lymphs 21 Not Estab. %   Monocytes 8 Not Estab. %   Eos 2 Not Estab. %   Basos 1 Not Estab. %   Neutrophils Absolute 4.3 1.4 - 7.0 x10E3/uL   Lymphocytes Absolute 1.4 0.7 - 3.1 x10E3/uL   Monocytes Absolute 0.5 0.1 - 0.9 x10E3/uL   EOS (ABSOLUTE) 0.1 0.0 - 0.4 x10E3/uL   Basophils Absolute 0.0 0.0 - 0.2 x10E3/uL   Immature Granulocytes 0 Not Estab. %   Immature Grans (Abs) 0.0 0.0 - 0.1 x10E3/uL  Comprehensive metabolic panel  Result Value Ref Range   Glucose 88 65 - 99 mg/dL   BUN 13 6 - 24 mg/dL   Creatinine, Ser 2.13 0.76 - 1.27 mg/dL   GFR calc non Af Amer 76 >59 mL/min/1.73   GFR calc Af Amer 88 >59  mL/min/1.73   BUN/Creatinine Ratio 11 9 - 20   Sodium 142 134 - 144 mmol/L   Potassium 4.4 3.5 - 5.2 mmol/L   Chloride 102 96 - 106 mmol/L   CO2 22 20 - 29 mmol/L   Calcium 10.0 8.7 - 10.2 mg/dL   Total Protein 7.2 6.0 - 8.5 g/dL   Albumin 5.0 3.5 - 5.5 g/dL   Globulin, Total 2.2 1.5 - 4.5 g/dL   Albumin/Globulin Ratio 2.3 (H) 1.2 - 2.2   Bilirubin Total 1.1 0.0 - 1.2 mg/dL   Alkaline Phosphatase 58 39 - 117 IU/L   AST 15 0 - 40 IU/L   ALT 17 0 - 44 IU/L  Lipid panel  Result Value Ref Range   Cholesterol, Total 186 100 - 199 mg/dL   Triglycerides 81 0 - 149 mg/dL   HDL 77 >16 mg/dL   VLDL Cholesterol Cal 16 5 - 40 mg/dL   LDL Calculated 93 0 - 99 mg/dL   Chol/HDL Ratio 2.4 0.0 - 5.0 ratio  PSA  Result Value Ref Range   Prostate Specific Ag, Serum 0.5 0.0 - 4.0 ng/mL  TSH  Result Value Ref Range   TSH 1.200 0.450 - 4.500  uIU/mL  Urinalysis, Routine w reflex microscopic  Result Value Ref Range   Specific Gravity, UA 1.010 1.005 - 1.030   pH, UA 5.5 5.0 - 7.5   Color, UA Yellow Yellow   Appearance Ur Clear Clear   Leukocytes, UA Negative Negative   Protein, UA Negative Negative/Trace   Glucose, UA Negative Negative   Ketones, UA Trace (A) Negative   RBC, UA Negative Negative   Bilirubin, UA Negative Negative   Urobilinogen, Ur 0.2 0.2 - 1.0 mg/dL   Nitrite, UA Negative Negative  HIV antibody (with reflex)  Result Value Ref Range   HIV Screen 4th Generation wRfx Non Reactive Non Reactive      Assessment & Plan:   Problem List Items Addressed This Visit      Other   Acute anxiety    Ongoing stress will cont klonopen discuss antidepressiants will hold off for now       Discussed aviation issues associated with stress.  Discussed ideation issues associated with antidepressants do not fly.  Follow up plan: Discuss follow-up 3 months or so for physical.  Patient will call if changes mind about starting antidepressants will call in the antidepressant but would need to follow-up within a week or 2 after starting medications.

## 2018-09-27 NOTE — Assessment & Plan Note (Signed)
Ongoing stress will cont klonopen discuss antidepressiants will hold off for now

## 2019-07-26 ENCOUNTER — Other Ambulatory Visit: Payer: Self-pay | Admitting: Family Medicine

## 2019-07-26 NOTE — Telephone Encounter (Signed)
Routing to provider  

## 2019-07-26 NOTE — Telephone Encounter (Signed)
Medication Refill - Medication: clonazePAM (KLONOPIN) 1 MG tablet  Preferred Pharmacy:  [place], [place] - [place] [phone] (Phone) [phone] (Fax)     Agent: Please be advised that RX refills may take up to 3 business days. We ask that you follow-up with your pharmacy.

## 2019-07-29 ENCOUNTER — Other Ambulatory Visit: Payer: Self-pay | Admitting: Family Medicine

## 2019-07-29 MED ORDER — CLONAZEPAM 1 MG PO TABS: 1.0000 mg | ORAL_TABLET | Freq: Every day | ORAL | 1 refills | Status: DC | PRN

## 2020-02-13 ENCOUNTER — Encounter: Payer: Self-pay | Admitting: Nurse Practitioner

## 2020-02-13 ENCOUNTER — Ambulatory Visit (INDEPENDENT_AMBULATORY_CARE_PROVIDER_SITE_OTHER): Payer: Self-pay | Admitting: Nurse Practitioner

## 2020-02-13 DIAGNOSIS — F329 Major depressive disorder, single episode, unspecified: Secondary | ICD-10-CM

## 2020-02-13 DIAGNOSIS — F32A Depression, unspecified: Secondary | ICD-10-CM

## 2020-02-13 DIAGNOSIS — F419 Anxiety disorder, unspecified: Secondary | ICD-10-CM

## 2020-02-13 MED ORDER — CLONAZEPAM 1 MG PO TABS: 1.0000 mg | ORAL_TABLET | Freq: Every day | ORAL | 1 refills | Status: DC | PRN

## 2020-02-13 NOTE — Assessment & Plan Note (Signed)
Chronic, ongoing.  Discussed other options for depression/anxiety control, but declines daily antidepressant medication for now.  Will refill clonazepam, should last through September 2021.

## 2020-02-13 NOTE — Progress Notes (Signed)
There were no vitals taken for this visit.   Subjective:    Patient ID: Antonio Frank, male    DOB: Jun 05, 1971, 49 y.o.   MRN: [id number]  HPI: Antonio Frank is a 49 y.o. male presenting today for anxiety follow up.  Chief Complaint  Patient presents with  . Anxiety   ANXIETY/STRESS Duration:uncontrolled Anxious mood: yes  Excessive worrying: yes Irritability: yes  Sweating: no Nausea/Vomiting: no Palpitations:yes notices with stress level Hyperventilation: no Panic attacks: yes Agoraphobia: no  Obscessions/compulsions: no Depressed mood: yes Depression screen [facility] 2/9 02/13/2020 01/04/2018 10/18/2016  Decreased Interest 2 0 0  Down, Depressed, Hopeless 3 0 0  PHQ - 2 Score 5 0 0  Altered sleeping 3 - -  Tired, decreased energy 3 - -  Change in appetite 1 - -  Feeling bad or failure about yourself  2 - -  Trouble concentrating 0 - -  Moving slowly or fidgety/restless 2 - -  Suicidal thoughts 1 - -  PHQ-9 Score 17 - -  Difficult doing work/chores Not difficult at all - -  Anhedonia: yes Weight changes: yes; but intentional Insomnia: yes hard to stay asleep  Hypersomnia: no Fatigue/loss of energy: no Feelings of worthlessness: yes Feelings of guilt: no Impaired concentration/indecisiveness: no Suicidal ideations: no; "too many people depend on me"  Homicidal thoughts: no Crying spells: no Recent Stressors/Life Changes: no   Relationship problems: no   Family stress: no     Financial stress: yes    Job stress: yes    Recent death/loss: no  Reports he is taking the about 1 clonazepam per week.  Reports he took Paxil in the past and it never helped with his anxiety and when he tried to come off of it, he felt "crazy" and had to wean himself off of it.    No Known Allergies  Outpatient Encounter Medications as of 02/13/2020  Medication Sig  . clonazePAM (KLONOPIN) 1 MG tablet Take 1 tablet (1 mg total) by mouth daily as needed for anxiety.  . [DISCONTINUED]  clonazePAM (KLONOPIN) 1 MG tablet Take 1 tablet (1 mg total) by mouth daily as needed for anxiety.   No facility-administered encounter medications on file as of 02/13/2020.   Patient Active Problem List   Diagnosis Date Noted  . Low serum vitamin D 05/20/2015  . Hypogonadism in male 05/19/2015  . Hypertension 05/19/2015  . Acute anxiety 05/19/2015   Past Medical History:  Diagnosis Date  . Anxiety   . Hypertension   . Migraines    Relevant past medical, surgical, family and social history reviewed and updated as indicated. Interim medical history since our last visit reviewed.  Review of Systems  Constitutional: Negative.  Negative for fatigue.  Eyes: Negative.   Respiratory: Negative.  Negative for cough, shortness of breath and wheezing.   Cardiovascular: Negative.  Negative for chest pain and palpitations.  Gastrointestinal: Negative.   Neurological: Negative.  Negative for dizziness and headaches.  Psychiatric/Behavioral: Positive for sleep disturbance. Negative for agitation, confusion, hallucinations and suicidal ideas. The patient is nervous/anxious. The patient is not hyperactive.    Per HPI unless specifically indicated above    Objective:    There were no vitals taken for this visit.  Wt Readings from Last 3 Encounters:  09/27/18 214 lb 8 oz (97.3 kg)  01/04/18 207 lb (93.9 kg)  10/18/16 210 lb (95.3 kg)    Physical Exam  Physical exam was unable to be performed due  to lack of equipment.    Assessment & Plan:   Problem List Items Addressed This Visit      Other   Acute anxiety - Primary    Chronic, ongoing.  Discussed other options for depression/anxiety control, but declines daily antidepressant medication for now.  Will refill clonazepam, should last through September 2021.       Relevant Medications   clonazePAM (KLONOPIN) 1 MG tablet       Follow up plan: Return if symptoms worsen or fail to improve.  This visit was completed via telephone due  to the restrictions of the COVID-19 pandemic. All issues as above were discussed and addressed but no physical exam was performed. If it was felt that the patient should be evaluated in the office, they were directed there. The patient verbally consented to this visit. Patient was unable to complete an audio/visual visit due to Lack of equipment. . Location of the patient: work . Location of the provider: work . Those involved with this call:  . Provider: Carnella Guadalajara, DNP . CMA: Tiffany Reel, CMA  . Front Desk/Registration: [facility]  . Time spent on call: 17 minutes on the phone discussing health concerns. 20 minutes total spent in review of patient's record and preparation of their chart.  I verified patient identity using two factors (patient name and date of birth). Patient consents verbally to being seen via telemedicine visit today.

## 2020-12-09 ENCOUNTER — Other Ambulatory Visit: Payer: Self-pay | Admitting: Nurse Practitioner

## 2020-12-09 DIAGNOSIS — F32A Depression, unspecified: Secondary | ICD-10-CM

## 2020-12-09 NOTE — Telephone Encounter (Signed)
Requested medication (s) are due for refill today: no  Requested medication (s) are on the active medication list: yes  Last refill:  02/13/2020  Future visit scheduled:no  Notes to clinic:  this refill cannot be delegated    Requested Prescriptions  Pending Prescriptions Disp Refills   clonazePAM (KLONOPIN) 1 MG tablet 30 tablet 1    Sig: Take 1 tablet (1 mg total) by mouth daily as needed for anxiety.      There is no refill protocol information for this order

## 2020-12-09 NOTE — Telephone Encounter (Signed)
FYI  unable to let me Done message pt is scheduled and verbalized understanding.

## 2020-12-09 NOTE — Telephone Encounter (Signed)
Pt scheduled for 12/10/2020

## 2020-12-09 NOTE — Telephone Encounter (Signed)
Overdue for visit. Please schedule f/up

## 2020-12-09 NOTE — Telephone Encounter (Signed)
Copied from CRM [id number]. Topic: Quick Communication - Rx Refill/Question >> Dec 09, 2020 10:42 AM Gaetana Michaelis A wrote: Medication: clonazePAM Antonio Frank)  Has the patient contacted their pharmacy? Yes - Pharmacy advised him to contact PCP  Preferred Pharmacy (with phone number or street name): [facility] - [place], [place] - [address]  Phone: [phone]  Agent: Please be advised that RX refills may take up to 3 business days. We ask that you follow-up with your pharmacy.

## 2020-12-10 ENCOUNTER — Ambulatory Visit (INDEPENDENT_AMBULATORY_CARE_PROVIDER_SITE_OTHER): Payer: Self-pay | Admitting: Family Medicine

## 2020-12-10 ENCOUNTER — Encounter: Payer: Self-pay | Admitting: Family Medicine

## 2020-12-10 ENCOUNTER — Other Ambulatory Visit: Payer: Self-pay

## 2020-12-10 VITALS — BP 135/84 | HR 78 | Temp 98.2°F | Ht 70.0 in | Wt 218.2 lb

## 2020-12-10 DIAGNOSIS — F419 Anxiety disorder, unspecified: Secondary | ICD-10-CM

## 2020-12-10 DIAGNOSIS — I10 Essential (primary) hypertension: Secondary | ICD-10-CM

## 2020-12-10 DIAGNOSIS — F32A Depression, unspecified: Secondary | ICD-10-CM

## 2020-12-10 MED ORDER — CLONAZEPAM 1 MG PO TABS: 1.0000 mg | ORAL_TABLET | Freq: Every day | ORAL | 1 refills | Status: DC | PRN

## 2020-12-10 NOTE — Assessment & Plan Note (Signed)
Uncontrolled. Extensive discussion had today regarding recommendation for controller therapy and counseling. Patient not willing to start today but will consider thinking about it, thinks anxiety will be better managed in a few months once insurance and living situation with son gets figured out. Has appropriate klonopin use, PDMP reviewed, refill provided. Follow up in 2 months, will revisit counseling/controller therapy at that time.

## 2020-12-10 NOTE — Assessment & Plan Note (Signed)
Around goal for age. Will defer labs for now given insurance issues, consider obtaining at follow up in 2 months.

## 2020-12-10 NOTE — Progress Notes (Addendum)
   SUBJECTIVE:   CHIEF COMPLAINT / HPI:   Patient Active Problem List   Diagnosis Date Noted  . Low serum vitamin D 05/20/2015  . Hypogonadism in male 05/19/2015  . Hypertension 05/19/2015  . Acute anxiety 05/19/2015   Hypertension: - Medications: none - Compliance: n/a - Checking BP at home: yes, 130s SBP. Higher with stress and weight gain. - Denies any SOB, CP, vision changes, LE edema, medication SEs, or symptoms of hypotension - Exercise: goes to the gym, mostly weights, some cardio  Anxiety - Medications: klonopin 1mg  prn - Taking: 1-2 per week - previously on Paxil, did not tolerate. - Counseling: no - Previous hospitalizations: no - FH of psych illness: mom with depression - Symptoms: difficulty sleeping, irritability, restlessness, worrying, fatigue - Current stressors: son and family recently moved back home, financial stress - Coping Mechanisms: plays banjo - thinks his anxiety will be better managed once living situation with son gets figured out. - No SI/HI.   Depression screen [employer] 2/9 12/10/2020 02/13/2020 01/04/2018 10/18/2016  Decreased Interest 1 2 0 0  Down, Depressed, Hopeless 2 3 0 0  PHQ - 2 Score 3 5 0 0  Altered sleeping 2 3 - -  Tired, decreased energy 3 3 - -  Change in appetite 2 1 - -  Feeling bad or failure about yourself  2 2 - -  Trouble concentrating 1 0 - -  Moving slowly or fidgety/restless 0 2 - -  Suicidal thoughts 1 1 - -  PHQ-9 Score 14 17 - -  Difficult doing work/chores Somewhat difficult Not difficult at all - -    GAD 7 : Generalized Anxiety Score 12/10/2020 02/13/2020 10/18/2016  Nervous, Anxious, on Edge 3 3 1   Control/stop worrying 3 3 1   Worry too much - different things 3 3 1   Trouble relaxing 2 3 0  Restless 3 3 0  Easily annoyed or irritable 2 2 1   Afraid - awful might happen 0 2 0  Total GAD 7 Score 16 19 4   Anxiety Difficulty Somewhat difficult Not difficult at all -    Healthcare Maintenance - Vaccines: due for  COVID vaccine - Colonoscopy: due - A1c: due - Lipid Panel: due   OBJECTIVE:   BP 135/84   Pulse 78   Temp 98.2 F (36.8 C) (Oral)   Ht 5\' 10"  (1.778 m)   Wt 218 lb 3.2 oz (99 kg)   SpO2 100%   BMI 31.31 kg/m   Gen: well appearing, in NAD Cards: reg rate Lungs: comfortable WOB on RA Psych: slightly anxious appearing, fidgety.  ASSESSMENT/PLAN:   Acute anxiety Uncontrolled. Extensive discussion had today regarding recommendation for controller therapy and counseling. Patient not willing to start today but will consider thinking about it, thinks anxiety will be better managed in a few months once insurance and living situation with son gets figured out. Has appropriate klonopin use, PDMP reviewed, refill provided. Follow up in 2 months, will revisit counseling/controller therapy at that time.  Hypertension Around goal for age. Will defer labs for now given insurance issues, consider obtaining at follow up in 2 months.     10/20/2016, DO

## 2020-12-10 NOTE — Patient Instructions (Signed)
It was great to see you!  Our plans for today:  - Think about a daily medication to help manage your anxiety and stress. If you decide to start counseling, let us know and we can help get you referred. - Follow up in 2 months for your anxiety. - Make an appointment for an annual physical soon.   Take care and seek immediate care sooner if you develop any concerns.   Dr. Linwood Dibbles

## 2021-05-24 ENCOUNTER — Ambulatory Visit
Admission: RE | Admit: 2021-05-24 | Discharge: 2021-05-24 | Disposition: A | Discharge status: Home / Self Care | Payer: BLUE CROSS/BLUE SHIELD | Source: Ambulatory Visit | Attending: Emergency Medicine | Admitting: Emergency Medicine

## 2021-05-24 ENCOUNTER — Other Ambulatory Visit: Payer: Self-pay

## 2021-05-24 VITALS — BP 148/99 | HR 74 | Temp 98.3°F | Resp 20 | Ht 70.0 in | Wt 218.3 lb

## 2021-05-24 DIAGNOSIS — J069 Acute upper respiratory infection, unspecified: Secondary | ICD-10-CM | POA: Diagnosis present

## 2021-05-24 DIAGNOSIS — U071 COVID-19: Secondary | ICD-10-CM | POA: Insufficient documentation

## 2021-05-24 DIAGNOSIS — Z20822 Contact with and (suspected) exposure to covid-19: Secondary | ICD-10-CM

## 2021-05-24 MED ORDER — ALBUTEROL SULFATE HFA 108 (90 BASE) MCG/ACT IN AERS: 1.0000 | INHALATION_SPRAY | RESPIRATORY_TRACT | 0 refills | Status: DC | PRN

## 2021-05-24 MED ORDER — FLUTICASONE PROPIONATE 50 MCG/ACT NA SUSP: 2.0000 | Freq: Every day | NASAL | 0 refills | Status: DC

## 2021-05-24 MED ORDER — BENZONATATE 200 MG PO CAPS: 200.0000 mg | ORAL_CAPSULE | Freq: Three times a day (TID) | ORAL | 0 refills | Status: DC | PRN

## 2021-05-24 MED ORDER — AEROCHAMBER PLUS MISC: 2 refills | Status: DC

## 2021-05-24 MED ORDER — DOXYCYCLINE HYCLATE 100 MG PO CAPS: 100.0000 mg | ORAL_CAPSULE | Freq: Two times a day (BID) | ORAL | 0 refills | Status: AC

## 2021-05-24 MED ORDER — HYDROCOD POLST-CPM POLST ER 10-8 MG/5ML PO SUER: 5.0000 mL | Freq: Two times a day (BID) | ORAL | 0 refills | Status: DC | PRN

## 2021-05-24 NOTE — ED Triage Notes (Signed)
Patient c/o cough and congestion that started on 6/10. He also reports fevers off and on but has not had a fever in 2 days. Patient completed a home COVID test and this was negative. He has had COVID twice already.

## 2021-05-24 NOTE — Discharge Instructions (Addendum)
2 puffs from your albuterol inhaler every 4 hours for 2 days, then every 6 hours for 2 days, then as needed.  Flonase, saline nasal irrigation with a Lloyd Huger Med rinse and distilled water as often as you want.  Start the doxycycline today.  Tessalon for the cough during the day, Tussionex for cough at night.  There are no cough syrups available that are on your formulary unfortunately.  I did try several alternatives.  Here is a list of primary care providers who are taking new patients:  Dr. Elizabeth Sauer [address] [place] [address] [phone]  [facility] [address] [place], [place] [address] [phone]  [facility] [place] [address] [place] [address]  [phone]  [facility] [address] [place], [place] [address] [phone]  [facility] [address] [place]  [phone] [place], [place] [address]  Here are clinics/ other resources who will see you if you do not have insurance. Some have certain criteria that you must meet. Call them and find out what they are:  [facility]: [address], [place], [place] [address] Phone: [phone] Hours: First and Third Saturdays of each Month, 9 a.m. - 1 p.m.  [facility]: [address], [address] [place], [place] [address] Phone: [phone] Hours: Tuesday, 4 p.m. - 8 p.m. Thursday, 1 p.m. - 8 p.m. Wednesday, 9 a.m. - [facility] [address] [place], [place] [address] Phone: [phone] Pharmacy Phone Number: [phone] Dental Phone Number: [phone] [facility] Insurance Help: [phone]  Dental Hours: Monday - Thursday, 8 a.m. - 6 p.m.  [facility] [address], [place], [place] [address] Phone: [phone] Pharmacy Phone Number: [phone] [facility] Insurance Help: [phone]  [facility] [address] [place] [place], [place] [address] Phone: [phone] Pharmacy  Phone Number: [phone] [facility] Insurance Help: [phone]  [facility] [address] [place], [place] [address] Phone: [phone] [facility] Insurance Help: [phone]   [facility] [address], [place], [place] [address] Phone: [phone]  Go to www.goodrx.com  or www.costplusdrugs.com to look up your medications. This will give you a list of where you can find your prescriptions at the most affordable prices. Or ask the pharmacist what the cash price is, or if they have any other discount programs available to help make your medication more affordable. This can be less expensive than what you would pay with insurance.

## 2021-05-24 NOTE — ED Provider Notes (Signed)
HPI  SUBJECTIVE:  Antonio Frank is a 50 y.o. male who presents with   Wife with identical symptoms   Past Medical History:  Diagnosis Date   Anxiety    Hypertension    Migraines     Past Surgical History:  Procedure Laterality Date   Broken nenck     Age 54   FRACTURE SURGERY  2001   Vertebral Fracture with hematoma   TONSILLECTOMY      Family History  Problem Relation Age of Onset   Hypertension Mother    Cancer Mother    Cancer Father        Throat cancer   Heart attack Sister    Cancer Maternal Grandmother     Social History   Tobacco Use   Smoking status: Some Days    Pack years: 0.00    Types: Cigars   Smokeless tobacco: Former    Types: Chew    Quit date: 02/03/2016  Vaping Use   Vaping Use: Never used  Substance Use Topics   Alcohol use: Yes    Comment: occasionally   Drug use: No    No current facility-administered medications for this encounter.  Current Outpatient Medications:    albuterol (VENTOLIN HFA) 108 (90 Base) MCG/ACT inhaler, Inhale 1-2 puffs into the lungs every 4 (four) hours as needed for wheezing or shortness of breath., Disp: 1 each, Rfl: 0   benzonatate (TESSALON) 200 MG capsule, Take 1 capsule (200 mg total) by mouth 3 (three) times daily as needed for cough., Disp: 30 capsule, Rfl: 0   chlorpheniramine-HYDROcodone (TUSSIONEX PENNKINETIC ER) 10-8 MG/5ML SUER, Take 5 mLs by mouth every 12 (twelve) hours as needed for cough., Disp: 60 mL, Rfl: 0   doxycycline (VIBRAMYCIN) 100 MG capsule, Take 1 capsule (100 mg total) by mouth 2 (two) times daily for 10 days., Disp: 20 capsule, Rfl: 0   fluticasone (FLONASE) 50 MCG/ACT nasal spray, Place 2 sprays into both nostrils daily., Disp: 16 g, Rfl: 0   Spacer/Aero-Holding Chambers (AEROCHAMBER PLUS) inhaler, Use with inhaler, Disp: 1 each, Rfl: 2  No Known Allergies   ROS  As noted in HPI.   Physical Exam  BP (!) 148/99 (BP Location: Left Arm)   Pulse 74   Temp 98.3 F (36.8 C)  (Oral)   Resp 20   Ht 5\' 10"  (1.778 m)   Wt 99 kg   SpO2 98%   BMI 31.32 kg/m   Constitutional: Well developed, well nourished, no acute distress Eyes:  EOMI, conjunctiva normal bilaterally HENT: Normocephalic, atraumatic,mucus membranes moist.  Mild nasal congestion.  Normal turbinates.  No maxillary, frontal sinus tenderness.  Tonsils surgically absent.  Positive postnasal drip. Neck: No cervical lymphadenopathy Respiratory: Normal inspiratory effort, lungs clear bilaterally.  No anterior, lateral chest wall tenderness Cardiovascular: Normal rate, regular rhythm, no murmurs rubs or gallop GI: nondistended skin: No rash, skin intact Musculoskeletal: no deformities Neurologic: Alert & oriented x 3, no focal neuro deficits Psychiatric: Speech and behavior appropriate   ED Course   Medications - No data to display  Orders Placed This Encounter  Procedures   SARS CORONAVIRUS 2 (TAT 6-24 HRS) Nasopharyngeal Nasopharyngeal Swab    Standing Status:   Standing    Number of Occurrences:   1    Order Specific Question:   Is this test for diagnosis or screening    Answer:   Diagnosis of ill patient    Order Specific Question:   Symptomatic for COVID-19 as  defined by CDC    Answer:   Yes    Order Specific Question:   Date of Symptom Onset    Answer:   05/14/2021    Order Specific Question:   Hospitalized for COVID-19    Answer:   No    Order Specific Question:   Admitted to ICU for COVID-19    Answer:   No    Order Specific Question:   Previously tested for COVID-19    Answer:   Yes    Order Specific Question:   Resident in a congregate (group) care setting    Answer:   No    Order Specific Question:   Employed in healthcare setting    Answer:   No    Order Specific Question:   Has patient completed COVID vaccination(s) (2 doses of Pfizer/Moderna 1 dose of [employer])    Answer:   No    Results for orders placed or performed during the hospital encounter of 05/24/21  (from the past 24 hour(s))  SARS CORONAVIRUS 2 (TAT 6-24 HRS) Nasopharyngeal Nasopharyngeal Swab     Status: Abnormal   Collection Time: 05/24/21  7:29 PM   Specimen: Nasopharyngeal Swab  Result Value Ref Range   SARS Coronavirus 2 POSITIVE (A) NEGATIVE   No results found.  ED Clinical Impression  1. COVID-19 virus infection   2. Viral URI with cough   3. Encounter for laboratory testing for COVID-19 virus      ED Assessment/Plan  [place] Narc[place] database reviewed for this patient, and feel that the risk/benefit ratio today is favorable for proceeding with a prescription for controlled substance..  No opiate prescriptions in 2 years.  Klonopin prescription in January 22.  Patient with a URI, concern for secondary sinusitis.  Suspect that this was COVID at first.  He is out of the treatment window for antivirals.  Flu testing not done as he is also out of the treatment window.  Will send home with doxycycline for a sinusitis given duration of symptoms, did not obtain chest x-ray as it would not change management, regularly scheduled albuterol inhaler with a spacer the next 4 days, Tessalon, Tussionex, saline nasal irrigation, Flonase.  Follow-up with PMD of choice.  Will place order in finding assistance in finding a PMD.  Patient is COVID-positive.  Unfortunately he is out of the window for treatment.  Supportive care.  Called both work and mobile phone, left message for patient to call back.  Will advise him to not fill the doxycycline but otherwise continue plan.  Discussed labs, MDM, treatment plan, and plan for follow-up with patient. Discussed sn/sx that should prompt return to the ED. patient agrees with plan.   Meds ordered this encounter  Medications   albuterol (VENTOLIN HFA) 108 (90 Base) MCG/ACT inhaler    Sig: Inhale 1-2 puffs into the lungs every 4 (four) hours as needed for wheezing or shortness of breath.    Dispense:  1 each    Refill:  0   fluticasone (FLONASE) 50 MCG/ACT  nasal spray    Sig: Place 2 sprays into both nostrils daily.    Dispense:  16 g    Refill:  0   Spacer/Aero-Holding Chambers (AEROCHAMBER PLUS) inhaler    Sig: Use with inhaler    Dispense:  1 each    Refill:  2    Please educate patient on use   benzonatate (TESSALON) 200 MG capsule    Sig: Take 1 capsule (200  mg total) by mouth 3 (three) times daily as needed for cough.    Dispense:  30 capsule    Refill:  0   chlorpheniramine-HYDROcodone (TUSSIONEX PENNKINETIC ER) 10-8 MG/5ML SUER    Sig: Take 5 mLs by mouth every 12 (twelve) hours as needed for cough.    Dispense:  60 mL    Refill:  0   doxycycline (VIBRAMYCIN) 100 MG capsule    Sig: Take 1 capsule (100 mg total) by mouth 2 (two) times daily for 10 days.    Dispense:  20 capsule    Refill:  0      *This clinic note was created using Scientist, clinical (histocompatibility and immunogenetics). Therefore, there may be occasional mistakes despite careful proofreading.  ?    Domenick Gong, MD 05/25/21 [phone]

## 2021-05-25 LAB — SARS CORONAVIRUS 2 (TAT 6-24 HRS): SARS Coronavirus 2: POSITIVE — AB

## 2021-06-23 ENCOUNTER — Telehealth: Payer: Self-pay | Admitting: Internal Medicine

## 2021-06-23 NOTE — Telephone Encounter (Unsigned)
Copied from CRM [id number]. Topic: Quick Communication - Rx Refill/Question >> Jun 23, 2021  1:32 PM Gaetana Michaelis A wrote: Medication: clonazePAM (KLONOPIN) 1 MG tablet  Has the patient contacted their pharmacy? Yes.   (Agent: If no, request that the patient contact the pharmacy for the refill.) (Agent: If yes, when and what did the pharmacy advise?)  Preferred Pharmacy (with phone number or street name): [facility] - [place], [place] - [address]  Phone:  [phone] Fax:  [phone]  Agent: Please be advised that RX refills may take up to 3 business days. We ask that you follow-up with your pharmacy.

## 2021-06-23 NOTE — Telephone Encounter (Signed)
Can this be filled ?

## 2021-06-23 NOTE — Telephone Encounter (Signed)
Scheduled 7/22

## 2021-06-23 NOTE — Telephone Encounter (Signed)
Cannot be filled since not on med list.  Pt needs appointment.

## 2021-06-23 NOTE — Telephone Encounter (Signed)
Requested medication not on current list  

## 2021-06-23 NOTE — Telephone Encounter (Signed)
Please get patient scheduled for a follow up 

## 2021-06-25 ENCOUNTER — Encounter: Payer: Self-pay | Admitting: Internal Medicine

## 2021-06-25 ENCOUNTER — Ambulatory Visit: Payer: Self-pay | Admitting: Internal Medicine

## 2021-06-25 ENCOUNTER — Other Ambulatory Visit: Payer: Self-pay

## 2021-06-25 VITALS — BP 134/83 | HR 76 | Temp 98.1°F | Ht 70.0 in | Wt 218.4 lb

## 2021-06-25 DIAGNOSIS — Z1211 Encounter for screening for malignant neoplasm of colon: Secondary | ICD-10-CM

## 2021-06-25 DIAGNOSIS — F419 Anxiety disorder, unspecified: Secondary | ICD-10-CM

## 2021-06-25 MED ORDER — CLONAZEPAM 1 MG PO TABS: 1.0000 mg | ORAL_TABLET | ORAL | 0 refills | Status: DC | PRN

## 2021-06-25 NOTE — Progress Notes (Signed)
BP 134/83   Pulse 76   Temp 98.1 F (36.7 C) (Oral)   Ht 5\' 10"  (1.778 m)   Wt 218 lb 6.4 oz (99.1 kg)   SpO2 99%   BMI 31.34 kg/m    Subjective:    Patient ID: , male    DOB: 1971-07-07, 50 y.o.   MRN: [id number]  Chief Complaint  Patient presents with   Anxiety    Wants a refill of Klonopin 1 mg    HPI: Antonio Frank is a 50 y.o. male  Anxiety Presents for initial visit. Onset was 1 to 6 months ago. Symptoms include irritability and nervous/anxious behavior. Patient reports no chest pain, confusion, decreased concentration, dizziness, malaise, muscle tension, nausea, palpitations, shortness of breath or suicidal ideas.     Chief Complaint  Patient presents with   Anxiety    Wants a refill of Klonopin 1 mg    Relevant past medical, surgical, family and social history reviewed and updated as indicated. Interim medical history since our last visit reviewed. Allergies and medications reviewed and updated.  Review of Systems  Constitutional:  Positive for irritability. Negative for activity change, appetite change, chills, fatigue and fever.  HENT:  Negative for congestion, ear discharge, ear pain and facial swelling.   Eyes:  Negative for pain, discharge and itching.  Respiratory:  Negative for cough, chest tightness, shortness of breath and wheezing.   Cardiovascular:  Negative for chest pain, palpitations and leg swelling.  Gastrointestinal:  Negative for abdominal distention, abdominal pain, blood in stool, constipation, diarrhea, nausea and vomiting.  Endocrine: Negative for cold intolerance, heat intolerance, polydipsia, polyphagia and polyuria.  Genitourinary:  Negative for difficulty urinating, dysuria, flank pain, frequency, hematuria and urgency.  Musculoskeletal:  Negative for arthralgias, gait problem, joint swelling and myalgias.  Skin:  Negative for color change, rash and wound.  Neurological:  Negative for dizziness, tremors, speech  difficulty, weakness, light-headedness, numbness and headaches.  Hematological:  Does not bruise/bleed easily.  Psychiatric/Behavioral:  Positive for behavioral problems. Negative for agitation, confusion, decreased concentration, sleep disturbance and suicidal ideas. The patient is nervous/anxious.    Per HPI unless specifically indicated above     Objective:    BP 134/83   Pulse 76   Temp 98.1 F (36.7 C) (Oral)   Ht 5\' 10"  (1.778 m)   Wt 218 lb 6.4 oz (99.1 kg)   SpO2 99%   BMI 31.34 kg/m   Wt Readings from Last 3 Encounters:  06/25/21 218 lb 6.4 oz (99.1 kg)  05/24/21 218 lb 4.1 oz (99 kg)  12/10/20 218 lb 3.2 oz (99 kg)    Physical Exam Vitals and nursing note reviewed.  Constitutional:      General: He is not in acute distress.    Appearance: Normal appearance. He is not ill-appearing or diaphoretic.  HENT:     Head: Normocephalic and atraumatic.     Right Ear: Tympanic membrane and external ear normal. There is no impacted cerumen.     Left Ear: External ear normal.     Nose: No congestion or rhinorrhea.     Mouth/Throat:     Pharynx: No oropharyngeal exudate or posterior oropharyngeal erythema.  Eyes:     Conjunctiva/sclera: Conjunctivae normal.     Pupils: Pupils are equal, round, and reactive to light.  Cardiovascular:     Rate and Rhythm: Normal rate and regular rhythm.     Heart sounds: No murmur heard.   No friction  rub. No gallop.  Pulmonary:     Effort: No respiratory distress.     Breath sounds: No stridor. No wheezing or rhonchi.  Chest:     Chest wall: No tenderness.  Abdominal:     General: Abdomen is flat. Bowel sounds are normal.     Palpations: Abdomen is soft. There is no mass.     Tenderness: There is no abdominal tenderness.  Musculoskeletal:     Cervical back: Normal range of motion and neck supple. No rigidity or tenderness.     Left lower leg: No edema.  Skin:    General: Skin is warm and dry.  Neurological:     Mental Status: He is  alert.    Results for orders placed or performed during the hospital encounter of 05/24/21  SARS CORONAVIRUS 2 (TAT 6-24 HRS) Nasopharyngeal Nasopharyngeal Swab   Specimen: Nasopharyngeal Swab  Result Value Ref Range   SARS Coronavirus 2 POSITIVE (A) NEGATIVE        Current Outpatient Medications:    albuterol (VENTOLIN HFA) 108 (90 Base) MCG/ACT inhaler, Inhale 1-2 puffs into the lungs every 4 (four) hours as needed for wheezing or shortness of breath. (Patient not taking: Reported on 06/25/2021), Disp: 1 each, Rfl: 0   benzonatate (TESSALON) 200 MG capsule, Take 1 capsule (200 mg total) by mouth 3 (three) times daily as needed for cough. (Patient not taking: Reported on 06/25/2021), Disp: 30 capsule, Rfl: 0   chlorpheniramine-HYDROcodone (TUSSIONEX PENNKINETIC ER) 10-8 MG/5ML SUER, Take 5 mLs by mouth every 12 (twelve) hours as needed for cough. (Patient not taking: Reported on 06/25/2021), Disp: 60 mL, Rfl: 0   clonazePAM (KLONOPIN) 1 MG tablet, Take 1 tablet (1 mg total) by mouth as needed for anxiety (per pt used 30 in 6 months)., Disp: 30 tablet, Rfl: 0   fluticasone (FLONASE) 50 MCG/ACT nasal spray, Place 2 sprays into both nostrils daily. (Patient not taking: Reported on 06/25/2021), Disp: 16 g, Rfl: 0   Sod Picosulfate-Mag Ox-Cit Acd (CLENPIQ) 10-3.5-12 MG-GM -GM/160ML SOLN, Take 320 mLs by mouth as directed., Disp: 320 mL, Rfl: 0   Spacer/Aero-Holding Chambers (AEROCHAMBER PLUS) inhaler, Use with inhaler (Patient not taking: Reported on 06/25/2021), Disp: 1 each, Rfl: 2    Assessment & Plan:  Anxiety:  Will conitnue potential Side effects dw pt. to call office if develops any SE. will fu with me in 1 month for such. pt verbalised understanding.      2. HM Cscope ordered      Problem List Items Addressed This Visit   None Visit Diagnoses     Anxiety    -  Primary   Relevant Orders   TSH   PSA   Lipid panel   CBC with Differential/Platelet   Comprehensive metabolic  panel   Urinalysis, Routine w reflex microscopic   Screen for colon cancer       Relevant Orders   Ambulatory referral to Gastroenterology        Orders Placed This Encounter  Procedures   TSH   PSA   Lipid panel   CBC with Differential/Platelet   Comprehensive metabolic panel   Urinalysis, Routine w reflex microscopic   Ambulatory referral to Gastroenterology     Meds ordered this encounter  Medications   clonazePAM (KLONOPIN) 1 MG tablet    Sig: Take 1 tablet (1 mg total) by mouth as needed for anxiety (per pt used 30 in 6 months).    Dispense:  30 tablet  Refill:  0     Follow up plan: No follow-ups on file.

## 2021-06-29 ENCOUNTER — Other Ambulatory Visit: Payer: Self-pay

## 2021-06-29 MED ORDER — CLENPIQ 10-3.5-12 MG-GM -GM/160ML PO SOLN: 320.0000 mL | ORAL | 0 refills | Status: DC

## 2021-12-28 ENCOUNTER — Other Ambulatory Visit: Payer: Self-pay

## 2021-12-29 ENCOUNTER — Telehealth: Payer: Self-pay

## 2021-12-29 NOTE — Telephone Encounter (Signed)
Preservice called and states that they talk to patient on Monday and he was trying to get insurance and he was going to need to cancel his procedure till his insurance went into effect. She told patient to call out office to reschedule his procedure. Called patient and left a message for call back to see if he wanted to reschedule his colonoscopy

## 2021-12-31 ENCOUNTER — Ambulatory Visit
Admission: RE | Admit: 2021-12-31 | Payer: BLUE CROSS/BLUE SHIELD | Source: Home / Self Care | Admitting: Gastroenterology

## 2021-12-31 ENCOUNTER — Encounter: Admission: RE | Payer: Self-pay | Source: Home / Self Care

## 2021-12-31 SURGERY — COLONOSCOPY
Anesthesia: General

## 2022-01-04 ENCOUNTER — Ambulatory Visit: Payer: Self-pay | Admitting: Internal Medicine

## 2022-04-07 ENCOUNTER — Ambulatory Visit (INDEPENDENT_AMBULATORY_CARE_PROVIDER_SITE_OTHER): Payer: 59 | Admitting: Internal Medicine

## 2022-04-07 ENCOUNTER — Encounter: Payer: Self-pay | Admitting: Internal Medicine

## 2022-04-07 VITALS — BP 116/77 | HR 60 | Temp 98.0°F | Ht 70.0 in | Wt 203.4 lb

## 2022-04-07 DIAGNOSIS — F419 Anxiety disorder, unspecified: Secondary | ICD-10-CM | POA: Insufficient documentation

## 2022-04-07 LAB — URINALYSIS, ROUTINE W REFLEX MICROSCOPIC
Bilirubin, UA: NEGATIVE
Glucose, UA: NEGATIVE
Ketones, UA: NEGATIVE
Leukocytes,UA: NEGATIVE
Nitrite, UA: NEGATIVE
Protein,UA: NEGATIVE
RBC, UA: NEGATIVE
Specific Gravity, UA: 1.025 (ref 1.005–1.030)
Urobilinogen, Ur: 0.2 mg/dL (ref 0.2–1.0)
pH, UA: 6 (ref 5.0–7.5)

## 2022-04-07 MED ORDER — CITALOPRAM HYDROBROMIDE 10 MG PO TABS: 10.0000 mg | ORAL_TABLET | Freq: Every day | ORAL | 1 refills | Status: DC

## 2022-04-07 MED ORDER — CLONAZEPAM 1 MG PO TABS: 1.0000 mg | ORAL_TABLET | Freq: Every day | ORAL | 0 refills | Status: DC

## 2022-04-07 NOTE — Progress Notes (Signed)
? ?BP 116/77   Pulse 60   Temp 98 ?F (36.7 ?C) (Oral)   Ht 5\' 10"  (1.778 m)   Wt 203 lb 6.4 oz (92.3 kg)   SpO2 99%   BMI 29.18 kg/m?   ? ?Subjective:  ? ? Patient ID: Antonio Frank, male    DOB: 07-Apr-1971, 51 y.o.   MRN: [id number] ? ?Chief Complaint  ?Patient presents with  ? Anxiety  ?  Medication refills  ? ? ?HPI: ?Antonio Frank is a 51 y.o. male ? ?Pt had a biceps tendon rupture he says he was wrestling with his sons friend who was 51 yrs old. His body couldn't handle this, he saw [facility] and had tendon surgery in November.  ?Was drinking almost a 1/5th a day and his bp was high then - went on a diet and better now. Stopped bp med over a month ago.  ?Stopped drinking now. Doesn't smoke at all either.  ?Was taking klonipin since he was 51 yrs old , took this as needed, grew up in a crazy family. His brother was in a mental institution and has a ho anxiety hasnt seen psych for such.  ? ?Anxiety ?Presents for follow-up visit. Symptoms include excessive worry, nervous/anxious behavior and restlessness. Patient reports no chest pain, compulsions, confusion, decreased concentration, depressed mood, dizziness, dry mouth, feeling of choking, hyperventilation, impotence, insomnia, irritability, malaise, muscle tension, nausea, obsessions, palpitations, panic, shortness of breath or suicidal ideas. Primary symptoms comment: takes klonipin as needed for anxiety specially since he quit drinking he feels more anxious..  ? ? ? ?Chief Complaint  ?Patient presents with  ? Anxiety  ?  Medication refills  ? ? ?Relevant past medical, surgical, family and social history reviewed and updated as indicated. Interim medical history since our last visit reviewed. ?Allergies and medications reviewed and updated. ? ?Review of Systems  ?Constitutional:  Negative for irritability.  ?Respiratory:  Negative for shortness of breath.   ?Cardiovascular:  Negative for chest pain and palpitations.  ?Gastrointestinal:  Negative for  nausea.  ?Genitourinary:  Negative for impotence.  ?Neurological:  Negative for dizziness.  ?Psychiatric/Behavioral:  Negative for confusion, decreased concentration and suicidal ideas. The patient is nervous/anxious. The patient does not have insomnia.   ? ?Per HPI unless specifically indicated above ? ?   ?Objective:  ?  ?BP 116/77   Pulse 60   Temp 98 ?F (36.7 ?C) (Oral)   Ht 5\' 10"  (1.778 m)   Wt 203 lb 6.4 oz (92.3 kg)   SpO2 99%   BMI 29.18 kg/m?   ?Wt Readings from Last 3 Encounters:  ?04/07/22 203 lb 6.4 oz (92.3 kg)  ?06/25/21 218 lb 6.4 oz (99.1 kg)  ?05/24/21 218 lb 4.1 oz (99 kg)  ?  ?Physical Exam ?Vitals and nursing note reviewed.  ?Constitutional:   ?   General: He is not in acute distress. ?   Appearance: Normal appearance. He is not ill-appearing or diaphoretic.  ?HENT:  ?   Head: Normocephalic and atraumatic.  ?   Right Ear: Tympanic membrane and external ear normal. There is no impacted cerumen.  ?   Left Ear: External ear normal.  ?   Nose: No congestion or rhinorrhea.  ?   Mouth/Throat:  ?   Pharynx: No oropharyngeal exudate or posterior oropharyngeal erythema.  ?Cardiovascular:  ?   Rate and Rhythm: Normal rate and regular rhythm.  ?   Heart sounds: No murmur heard. ?  No friction rub.  No gallop.  ?Pulmonary:  ?   Effort: No respiratory distress.  ?   Breath sounds: No stridor. No wheezing or rhonchi.  ?Chest:  ?   Chest wall: No tenderness.  ?Abdominal:  ?   General: Abdomen is flat. Bowel sounds are normal. There is no distension.  ?   Palpations: Abdomen is soft. There is no mass.  ?Musculoskeletal:     ?   General: No swelling or tenderness.  ?   Cervical back: Normal range of motion and neck supple. No rigidity or tenderness.  ?   Left lower leg: No edema.  ?Neurological:  ?   Mental Status: He is alert.  ? ? ?Results for orders placed or performed in visit on 04/07/22  ?Urinalysis, Routine w reflex microscopic  ?Result Value Ref Range  ? Specific Gravity, UA 1.025 1.005 - 1.030  ?  pH, UA 6.0 5.0 - 7.5  ? Color, UA Yellow Yellow  ? Appearance Ur Clear Clear  ? Leukocytes,UA Negative Negative  ? Protein,UA Negative Negative/Trace  ? Glucose, UA Negative Negative  ? Ketones, UA Negative Negative  ? RBC, UA Negative Negative  ? Bilirubin, UA Negative Negative  ? Urobilinogen, Ur 0.2 0.2 - 1.0 mg/dL  ? Nitrite, UA Negative Negative  ? ?   ? ? ?Current Outpatient Medications:  ?  citalopram (CELEXA) 10 MG tablet, Take 1 tablet (10 mg total) by mouth daily., Disp: 30 tablet, Rfl: 1 ?  clonazePAM (KLONOPIN) 1 MG tablet, Take 1 tablet (1 mg total) by mouth as needed for anxiety (per pt used 30 in 6 months)., Disp: 30 tablet, Rfl: 0 ?  clonazePAM (KLONOPIN) 1 MG tablet, Take 1 tablet (1 mg total) by mouth daily., Disp: 30 tablet, Rfl: 0 ?  Sod Picosulfate-Mag Ox-Cit Acd (CLENPIQ) 10-3.5-12 MG-GM -GM/160ML SOLN, Take 320 mLs by mouth as directed., Disp: 320 mL, Rfl: 0 ?  Spacer/Aero-Holding Chambers (AEROCHAMBER PLUS) inhaler, Use with inhaler (Patient not taking: Reported on 06/25/2021), Disp: 1 each, Rfl: 2  ? ? ?Assessment & Plan:  ?Anxiety : Will need to fu with psychiatry for such. ?Start celexa 10 mg daily for such  ?Will need labs not had any in 3 years. ?Pt reluctant to get this done but finally agrees ?Will need to fu [place] more frequently on chart review pt hasnt had regular follow ups. ? ? ?Problem List Items Addressed This Visit   ? ?  ? Other  ? Anxiety - Primary  ? Relevant Medications  ? citalopram (CELEXA) 10 MG tablet  ? Other Relevant Orders  ? CBC with Differential/Platelet  ? Comprehensive metabolic panel  ? Urinalysis, Routine w reflex microscopic (Completed)  ? TSH  ?  ? ?Orders Placed This Encounter  ?Procedures  ? CBC with Differential/Platelet  ? Comprehensive metabolic panel  ? Urinalysis, Routine w reflex microscopic  ? TSH  ?  ? ?Meds ordered this encounter  ?Medications  ? citalopram (CELEXA) 10 MG tablet  ?  Sig: Take 1 tablet (10 mg total) by mouth daily.  ?  Dispense:  30  tablet  ?  Refill:  1  ? clonazePAM (KLONOPIN) 1 MG tablet  ?  Sig: Take 1 tablet (1 mg total) by mouth daily.  ?  Dispense:  30 tablet  ?  Refill:  0  ?  ? ?Follow up plan: ?Return in about 6 weeks (around 05/19/2022). ? ? ?

## 2022-04-08 LAB — CBC WITH DIFFERENTIAL/PLATELET
Basophils Absolute: 0.1 10*3/uL (ref 0.0–0.2)
Basos: 1 %
EOS (ABSOLUTE): 0.3 10*3/uL (ref 0.0–0.4)
Eos: 4 %
Hematocrit: 47.4 % (ref 37.5–51.0)
Hemoglobin: 15.9 g/dL (ref 13.0–17.7)
Immature Grans (Abs): 0 10*3/uL (ref 0.0–0.1)
Immature Granulocytes: 0 %
Lymphocytes Absolute: 2.1 10*3/uL (ref 0.7–3.1)
Lymphs: 32 %
MCH: 31.7 pg (ref 26.6–33.0)
MCHC: 33.5 g/dL (ref 31.5–35.7)
MCV: 95 fL (ref 79–97)
Monocytes Absolute: 0.5 10*3/uL (ref 0.1–0.9)
Monocytes: 8 %
Neutrophils Absolute: 3.7 10*3/uL (ref 1.4–7.0)
Neutrophils: 55 %
Platelets: 356 10*3/uL (ref 150–450)
RBC: 5.01 x10E6/uL (ref 4.14–5.80)
RDW: 11.5 % — ABNORMAL LOW (ref 11.6–15.4)
WBC: 6.7 10*3/uL (ref 3.4–10.8)

## 2022-04-08 LAB — COMPREHENSIVE METABOLIC PANEL
ALT: 39 IU/L (ref 0–44)
AST: 24 IU/L (ref 0–40)
Albumin/Globulin Ratio: 2.1 (ref 1.2–2.2)
Albumin: 4.6 g/dL (ref 3.8–4.9)
Alkaline Phosphatase: 80 IU/L (ref 44–121)
BUN/Creatinine Ratio: 17 (ref 9–20)
BUN: 19 mg/dL (ref 6–24)
Bilirubin Total: 0.4 mg/dL (ref 0.0–1.2)
CO2: 18 mmol/L — ABNORMAL LOW (ref 20–29)
Calcium: 9.8 mg/dL (ref 8.7–10.2)
Chloride: 104 mmol/L (ref 96–106)
Creatinine, Ser: 1.15 mg/dL (ref 0.76–1.27)
Globulin, Total: 2.2 g/dL (ref 1.5–4.5)
Glucose: 97 mg/dL (ref 70–99)
Potassium: 4.7 mmol/L (ref 3.5–5.2)
Sodium: 139 mmol/L (ref 134–144)
Total Protein: 6.8 g/dL (ref 6.0–8.5)
eGFR: 77 mL/min/{1.73_m2} (ref >59)

## 2022-04-08 LAB — TSH: TSH: 0.703 u[IU]/mL (ref 0.450–4.500)

## 2022-05-18 ENCOUNTER — Other Ambulatory Visit: Payer: Self-pay | Admitting: Internal Medicine

## 2022-05-18 NOTE — Telephone Encounter (Signed)
Requested medication (s) are due for refill today: yes  Requested medication (s) are on the active medication list: yes    Last refill: 04/07/22 #30  0 refills  Future visit scheduled yes 05/24/22  Notes to clinic:Not delegated, please review. Thank you.  Requested Prescriptions  Pending Prescriptions Disp Refills   clonazePAM (KLONOPIN) 1 MG tablet [Pharmacy Med Name: CLONAZEPAM 1 MG TABLET] 30 tablet 0    Sig: Take 1 tablet (1 mg total) by mouth daily.     Not Delegated - Psychiatry: Anxiolytics/Hypnotics 2 Failed - 05/18/2022  2:12 PM      Failed - This refill cannot be delegated      Failed - Urine Drug Screen completed in last 360 days      Passed - Patient is not pregnant      Passed - Valid encounter within last 6 months    Recent Outpatient Visits           1 month ago Anxiety   [facility] Vigg, Avanti, MD   10 months ago Anxiety   [facility] Vigg, Avanti, MD   1 year ago Acute anxiety   [facility] Caro Laroche, DO   2 years ago Anxiety and depression   [facility] Valentino Nose, NP   3 years ago Acute anxiety   [facility] Crissman, Redge Gainer, MD       Future Appointments             In 6 days Vigg, Avanti, MD [facility], [facility]

## 2022-05-24 ENCOUNTER — Ambulatory Visit (INDEPENDENT_AMBULATORY_CARE_PROVIDER_SITE_OTHER): Payer: 59 | Admitting: Internal Medicine

## 2022-05-24 ENCOUNTER — Encounter: Payer: Self-pay | Admitting: Internal Medicine

## 2022-05-24 VITALS — BP 118/78 | HR 56 | Temp 98.0°F | Ht 70.0 in | Wt 201.0 lb

## 2022-05-24 DIAGNOSIS — H9221 Otorrhagia, right ear: Secondary | ICD-10-CM | POA: Diagnosis not present

## 2022-05-24 DIAGNOSIS — F419 Anxiety disorder, unspecified: Secondary | ICD-10-CM | POA: Diagnosis not present

## 2022-05-24 MED ORDER — CLONAZEPAM 1 MG PO TABS: 1.0000 mg | ORAL_TABLET | Freq: Every day | ORAL | 0 refills | Status: DC

## 2022-05-24 MED ORDER — CITALOPRAM HYDROBROMIDE 20 MG PO TABS: 20.0000 mg | ORAL_TABLET | Freq: Every day | ORAL | 3 refills | Status: DC

## 2022-05-24 NOTE — Progress Notes (Signed)
BP 118/78   Pulse (!) 56   Temp 98 F (36.7 C) (Oral)   Ht _0  (1.778 m)   Wt 201 lb (91.2 kg)   SpO2 98%   BMI 28.84 kg/m    Subjective:    Patient ID: Antonio Frank, male    DOB: Dec 25, 1970, 51 y.o.   MRN: [id number]  Chief Complaint  Patient presents with   Anxiety   Depression   Ear Pain    Right ear pain x 2 days, patient states that is has been bleeding    HPI: Antonio Frank is a 51 y.o. male  Anxiety Presents for follow-up (celexa helped some) visit. Patient reports no compulsions, depressed mood, dry mouth, excessive worry, feeling of choking, hyperventilation, malaise, muscle tension, nausea, obsessions, panic or shortness of breath.    Otalgia  There is pain in the right ear. This is a new problem. The current episode started in the past 7 days. There has been no fever. Associated symptoms include ear discharge. Pertinent negatives include no abdominal pain, coughing, diarrhea, hearing loss, rash, rhinorrhea, sore throat or vomiting. Associated symptoms comments: Had a head cold 2 weeks ago and felt his ears were stopped up this resolved no ho trauma / no recnet travle in a flight..    Chief Complaint  Patient presents with   Anxiety   Depression   Ear Pain    Right ear pain x 2 days, patient states that is has been bleeding    Relevant past medical, surgical, family and social history reviewed and updated as indicated. Interim medical history since our last visit reviewed. Allergies and medications reviewed and updated.  Review of Systems  HENT:  Positive for ear discharge and ear pain. Negative for hearing loss, rhinorrhea and sore throat.   Respiratory:  Negative for cough and shortness of breath.   Gastrointestinal:  Negative for abdominal pain, diarrhea, nausea and vomiting.  Skin:  Negative for rash.    Per HPI unless specifically indicated above     Objective:    BP 118/78   Pulse (!) 56   Temp 98 F (36.7 C) (Oral)   Ht _1  (1.778  m)   Wt 201 lb (91.2 kg)   SpO2 98%   BMI 28.84 kg/m   Wt Readings from Last 3 Encounters:  05/24/22 201 lb (91.2 kg)  04/07/22 203 lb 6.4 oz (92.3 kg)  06/25/21 218 lb 6.4 oz (99.1 kg)    Physical Exam Vitals and nursing note reviewed.  Constitutional:      General: He is not in acute distress.    Appearance: Normal appearance. He is not ill-appearing or diaphoretic.  HENT:     Head: Normocephalic and atraumatic.     Right Ear: Tympanic membrane and external ear normal. There is no impacted cerumen.     Left Ear: External ear normal.     Ears:     Comments: Rt ear shows blood     Nose: No congestion or rhinorrhea.     Mouth/Throat:     Pharynx: No oropharyngeal exudate or posterior oropharyngeal erythema.  Eyes:     Conjunctiva/sclera: Conjunctivae normal.     Pupils: Pupils are equal, round, and reactive to light.  Cardiovascular:     Rate and Rhythm: Normal rate and regular rhythm.     Heart sounds: No murmur heard.    No friction rub. No gallop.  Pulmonary:     Effort: No respiratory distress.  Breath sounds: No stridor. No wheezing or rhonchi.  Chest:     Chest wall: No tenderness.  Abdominal:     General: Abdomen is flat. Bowel sounds are normal.     Palpations: Abdomen is soft. There is no mass.     Tenderness: There is no abdominal tenderness.  Musculoskeletal:     Cervical back: Normal range of motion and neck supple. No rigidity or tenderness.     Left lower leg: No edema.  Skin:    General: Skin is warm and dry.  Neurological:     Mental Status: He is alert.     Results for orders placed or performed in visit on 04/07/22  CBC with Differential/Platelet  Result Value Ref Range   WBC 6.7 3.4 - 10.8 x10E3/uL   RBC 5.01 4.14 - 5.80 x10E6/uL   Hemoglobin 15.9 13.0 - 17.7 g/dL   Hematocrit 47.4 37.5 - 51.0 %   MCV 95 79 - 97 fL   MCH 31.7 26.6 - 33.0 pg   MCHC 33.5 31.5 - 35.7 g/dL   RDW 11.5 (L) 11.6 - 15.4 %   Platelets 356 150 - 450 x10E3/uL    Neutrophils 55 Not Estab. %   Lymphs 32 Not Estab. %   Monocytes 8 Not Estab. %   Eos 4 Not Estab. %   Basos 1 Not Estab. %   Neutrophils Absolute 3.7 1.4 - 7.0 x10E3/uL   Lymphocytes Absolute 2.1 0.7 - 3.1 x10E3/uL   Monocytes Absolute 0.5 0.1 - 0.9 x10E3/uL   EOS (ABSOLUTE) 0.3 0.0 - 0.4 x10E3/uL   Basophils Absolute 0.1 0.0 - 0.2 x10E3/uL   Immature Granulocytes 0 Not Estab. %   Immature Grans (Abs) 0.0 0.0 - 0.1 x10E3/uL  Comprehensive metabolic panel  Result Value Ref Range   Glucose 97 70 - 99 mg/dL   BUN 19 6 - 24 mg/dL   Creatinine, Ser 1.15 0.76 - 1.27 mg/dL   eGFR 77 >59 mL/min/1.73   BUN/Creatinine Ratio 17 9 - 20   Sodium 139 134 - 144 mmol/L   Potassium 4.7 3.5 - 5.2 mmol/L   Chloride 104 96 - 106 mmol/L   CO2 18 (L) 20 - 29 mmol/L   Calcium 9.8 8.7 - 10.2 mg/dL   Total Protein 6.8 6.0 - 8.5 g/dL   Albumin 4.6 3.8 - 4.9 g/dL   Globulin, Total 2.2 1.5 - 4.5 g/dL   Albumin/Globulin Ratio 2.1 1.2 - 2.2   Bilirubin Total 0.4 0.0 - 1.2 mg/dL   Alkaline Phosphatase 80 44 - 121 IU/L   AST 24 0 - 40 IU/L   ALT 39 0 - 44 IU/L  Urinalysis, Routine w reflex microscopic  Result Value Ref Range   Specific Gravity, UA 1.025 1.005 - 1.030   pH, UA 6.0 5.0 - 7.5   Color, UA Yellow Yellow   Appearance Ur Clear Clear   Leukocytes,UA Negative Negative   Protein,UA Negative Negative/Trace   Glucose, UA Negative Negative   Ketones, UA Negative Negative   RBC, UA Negative Negative   Bilirubin, UA Negative Negative   Urobilinogen, Ur 0.2 0.2 - 1.0 mg/dL   Nitrite, UA Negative Negative  TSH  Result Value Ref Range   TSH 0.703 0.450 - 4.500 uIU/mL        Current Outpatient Medications:    clonazePAM (KLONOPIN) 1 MG tablet, Take 1 tablet (1 mg total) by mouth as needed for anxiety (per pt used 30 in 6 months)., Disp: 30 tablet,  Rfl: 0   citalopram (CELEXA) 20 MG tablet, Take 1 tablet (20 mg total) by mouth daily., Disp: 30 tablet, Rfl: 3   clonazePAM (KLONOPIN) 1 MG tablet,  Take 1 tablet (1 mg total) by mouth daily., Disp: 30 tablet, Rfl: 0   Sod Picosulfate-Mag Ox-Cit Acd (CLENPIQ) 10-3.5-12 MG-GM -GM/160ML SOLN, Take 320 mLs by mouth as directed., Disp: 320 mL, Rfl: 0   Spacer/Aero-Holding Chambers (AEROCHAMBER PLUS) inhaler, Use with inhaler (Patient not taking: Reported on 06/25/2021), Disp: 1 each, Rfl: 2    Assessment & Plan:  Anxiety : Will need to fu with psychiatry for such. Start celexa 10 mg daily for such  Will need labs not had any in 3 years. Pt reluctant to get this done but finally agrees Will need to fu [place] more frequently on chart review pt hasnt had regular follow ups.  Ear pain Will refer toENT  No s/s of an ear infection, if bleeding persists will need to go to the ER.  Problem List Items Addressed This Visit       Nervous and Auditory   Otorrhagia of right ear - Primary   Relevant Orders   Ambulatory referral to ENT     Other   Anxiety   Relevant Medications   citalopram (CELEXA) 20 MG tablet     Orders Placed This Encounter  Procedures   Ambulatory referral to ENT     Meds ordered this encounter  Medications   citalopram (CELEXA) 20 MG tablet    Sig: Take 1 tablet (20 mg total) by mouth daily.    Dispense:  30 tablet    Refill:  3   clonazePAM (KLONOPIN) 1 MG tablet    Sig: Take 1 tablet (1 mg total) by mouth daily.    Dispense:  30 tablet    Refill:  0     Follow up plan: Return in about 3 months (around 08/24/2022).

## 2022-08-16 ENCOUNTER — Encounter: Payer: Self-pay | Admitting: Internal Medicine

## 2022-08-23 ENCOUNTER — Telehealth: Payer: Self-pay | Admitting: Family Medicine

## 2022-08-24 ENCOUNTER — Other Ambulatory Visit: Payer: Self-pay

## 2022-08-24 NOTE — Telephone Encounter (Signed)
Sent to provider for approval for a 5 day supply

## 2022-08-24 NOTE — Telephone Encounter (Signed)
Medication refill for clonazepm 1mg  last ov 05/24/22, upcoming ov 08/31/22 . Please advise Patient states he is out.

## 2022-08-24 NOTE — Telephone Encounter (Signed)
Requested medication (s) are due for refill today: yes  Requested medication (s) are on the active medication list: yes  Last refill:  05/24/22  Future visit scheduled: yes  Notes to clinic:  Unable to refill per protocol, cannot delegate.      Requested Prescriptions  Pending Prescriptions Disp Refills   clonazePAM (KLONOPIN) 1 MG tablet [Pharmacy Med Name: CLONAZEPAM 1 MG TABLET] 30 tablet 0    Sig: Take 1 tablet (1 mg total) by mouth daily.     Not Delegated - Psychiatry: Anxiolytics/Hypnotics 2 Failed - 08/23/2022  3:38 PM      Failed - This refill cannot be delegated      Failed - Urine Drug Screen completed in last 360 days      Passed - Patient is not pregnant      Passed - Valid encounter within last 6 months    Recent Outpatient Visits           3 months ago Otorrhagia of right ear   [facility] Vigg, Avanti, MD   4 months ago Anxiety   [facility] Vigg, Avanti, MD   1 year ago Nara Visa, MD   1 year ago Acute anxiety   [facility] Myles Gip, DO   2 years ago Anxiety and depression   [facility] Eulogio Bear, NP       Future Appointments             In 1 week Mecum, Dani Gobble, PA-C [facility], [facility]

## 2022-08-24 NOTE — Telephone Encounter (Signed)
Pt called stating that he wants receive this refill since he has scheduled an appt

## 2022-08-30 ENCOUNTER — Ambulatory Visit: Payer: 59 | Admitting: Unknown Physician Specialty

## 2022-08-31 ENCOUNTER — Encounter: Payer: Self-pay | Admitting: Physician Assistant

## 2022-08-31 ENCOUNTER — Telehealth (INDEPENDENT_AMBULATORY_CARE_PROVIDER_SITE_OTHER): Payer: 59 | Admitting: Physician Assistant

## 2022-08-31 DIAGNOSIS — R451 Restlessness and agitation: Secondary | ICD-10-CM | POA: Diagnosis not present

## 2022-08-31 DIAGNOSIS — F419 Anxiety disorder, unspecified: Secondary | ICD-10-CM

## 2022-08-31 MED ORDER — CLONAZEPAM 1 MG PO TABS: 1.0000 mg | ORAL_TABLET | Freq: Every day | ORAL | 0 refills | Status: DC

## 2022-08-31 MED ORDER — DULOXETINE HCL 30 MG PO CPEP: 30.0000 mg | ORAL_CAPSULE | Freq: Every day | ORAL | 1 refills | Status: DC

## 2022-08-31 NOTE — Progress Notes (Signed)
Virtual Visit via Video Note  I connected with Antonio Frank on 09/01/22 at 10:00 AM EDT by a video enabled telemedicine application and verified that I am speaking with the correct person using two identifiers.  Today's Provider: Talitha Givens, MHS, PA-C Introduced myself to the patient as a PA-C and provided education on APPs in clinical practice.    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 The patient expressed understanding and agreed to proceed.   Chief Complaint  Patient presents with   Anxiety    Requesting refill on klonopin    History of Present Illness:    ANXIETY/STRESS  States he has been on Klonopin for a number of years and is not  Reports he stopped taking the Citalopram because he did not notice any impact on anxiety but noticed decreased libido and difficulty losing weight.   States he has stopped drinking and smoking - reports he started having increased anxiety since stopping this. 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   Duration:exacerbated Anxious mood: yes  Excessive worrying: yes Irritability: yes  Sweating: no Nausea: no Palpitations:no Hyperventilation: no Panic attacks: yes Agoraphobia: no  Obscessions/compulsions: no Depressed mood: no    09/23/22    9:43 AM 05/24/2022   10:16 AM 04/07/2022   10:28 AM 06/25/2021    9:52 AM 12/10/2020   10:40 AM  Depression screen PHQ 2/9  Decreased Interest 1 1 0 0 1  Down, Depressed, Hopeless 1 2 1 1 2   PHQ - 2 Score 2 3 1 1 3   Altered sleeping 1 0 3 1 2   Tired, decreased energy 0 0 2 1 3   Change in appetite 0 0 1 0 2  Feeling bad or failure about yourself  0 1 1 0 2  Trouble concentrating 0 1 0 0 1  Moving slowly or fidgety/restless 0 0 0 0 0   Suicidal thoughts 0 0 0 0 1  PHQ-9 Score 3 5 8 3 14   Difficult doing work/chores Not difficult at all Somewhat difficult Somewhat difficult Somewhat difficult Somewhat difficult   Anhedonia: no Weight changes: yes- has been exercising, expected weight changes Insomnia: yes hard to fall asleep - reports Klonopin helps with this  Hypersomnia: no Fatigue/loss of energy: no Feelings of worthlessness: no Feelings of guilt: no Impaired concentration/indecisiveness: no Suicidal ideations: no  Crying spells: no Recent Stressors/Life Changes: yes   Relationship problems: yes   Family stress: yes     Financial stress: yes    Job stress: yes    Recent death/loss: no     09/23/22    9:45 AM 05/24/2022   10:16 AM 04/07/2022   10:28 AM 06/25/2021    9:51 AM  GAD 7 : Generalized Anxiety Score  Nervous, Anxious, on Edge 1 2 3 1   Control/stop worrying 3 2 2 1   Worry too much - different things 3 2 3 3   Trouble relaxing 1 1 2 1   Restless 1 0 2 0  Easily annoyed or irritable 3 1 2 1   Afraid - awful might happen 0 0 0 0  Total GAD 7 Score 12 8 14 7   Anxiety Difficulty Not difficult at all Somewhat difficult Somewhat difficult Somewhat difficult  Reports he is walking every day -2-3 miles per day  Reports increased exercise has helped with mood     Review of Systems  Cardiovascular:  Negative for chest pain, palpitations and leg swelling.  Neurological:  Negative for dizziness and headaches.  Psychiatric/Behavioral:  Negative for depression, substance abuse and suicidal ideas. The patient is nervous/anxious.      Observations/Objective:  Due to the nature of the virtual visit, physical exam and observations are limited. Able to obtain the following observations:   Alert, oriented, Appears comfortable, in no acute distress.  No scleral injection, no appreciated hoarseness, tachypnea, wheeze or strider. Able to maintain conversation without visible strain.  No cough appreciated  during visit.    Assessment and Plan:  Problem List Items Addressed This Visit       Other   Anxiety with agitation - Primary    Chronic, ongoing concern Reports he tried taking Citalopram but did not notice any impact on anxiety symptoms even with dose increase- experienced libido and weight loss difficulties Discussed importance of controller medication to help reduce need for klonopin and reduce overall levels of anxiety He is amenable to trying Cymbalta- discussed side effects and mechanism of action Will send in script for Cymbalta 30 mg PO QD and refill 30 tablets of Klonopin 1 mg PO QD Reviewed expectation that as Cymbalta reaches therapeutic levels and if anxiety is improved, Clonazepam use should be reduced as well- he voiced understanding and seems amenable to trying this regimen Recommend follow up in 4-6 weeks to assess response and sign Clonazepam agreement if this is going to be lingering part of regimen        Relevant Medications   DULoxetine (CYMBALTA) 30 MG capsule   clonazePAM (KLONOPIN) 1 MG tablet   Follow Up Instructions:    I discussed the assessment and treatment plan with the patient. The patient was provided an opportunity to ask questions and all were answered. The patient agreed with the plan and demonstrated an understanding of the instructions.   The patient was advised to call back or seek an in-person evaluation if the symptoms worsen or if the condition fails to improve as anticipated.  I provided 27 minutes of non-face-to-face time during this encounter.  Return in about 6 weeks (around 10/12/2022) for anxiety, new start on Cymbalta .   I, [place]afen E Dewon Mendizabal, PA-C, have reviewed all documentation for this visit. The documentation on 09/01/22 for the exam, diagnosis, procedures, and orders are all accurate and complete.   Talitha Givens, MHS, PA-C [facility]

## 2022-09-01 NOTE — Assessment & Plan Note (Signed)
Chronic, ongoing concern Reports he tried taking Citalopram but did not notice any impact on anxiety symptoms even with dose increase- experienced libido and weight loss difficulties Discussed importance of controller medication to help reduce need for klonopin and reduce overall levels of anxiety He is amenable to trying Cymbalta- discussed side effects and mechanism of action Will send in script for Cymbalta 30 mg PO QD and refill 30 tablets of Klonopin 1 mg PO QD Reviewed expectation that as Cymbalta reaches therapeutic levels and if anxiety is improved, Clonazepam use should be reduced as well- he voiced understanding and seems amenable to trying this regimen Recommend follow up in 4-6 weeks to assess response and sign Clonazepam agreement if this is going to be lingering part of regimen

## 2022-09-28 ENCOUNTER — Other Ambulatory Visit: Payer: Self-pay | Admitting: Physician Assistant

## 2022-09-28 DIAGNOSIS — F419 Anxiety disorder, unspecified: Secondary | ICD-10-CM

## 2022-09-28 NOTE — Telephone Encounter (Signed)
Pt called to check on refill request for clonazePAM (KLONOPIN) 1 MG tablet and stated he doesn't like taking the Cymbalta and wanted to speak with Junie Panning about an alternative or another option / please advise

## 2022-09-29 ENCOUNTER — Other Ambulatory Visit: Payer: Self-pay | Admitting: Physician Assistant

## 2022-09-29 ENCOUNTER — Encounter: Payer: Self-pay | Admitting: Physician Assistant

## 2022-09-29 ENCOUNTER — Ambulatory Visit: Payer: 59 | Admitting: Physician Assistant

## 2022-09-29 VITALS — BP 125/83 | HR 58 | Temp 98.3°F | Wt 190.1 lb

## 2022-09-29 DIAGNOSIS — F5105 Insomnia due to other mental disorder: Secondary | ICD-10-CM

## 2022-09-29 DIAGNOSIS — F99 Mental disorder, not otherwise specified: Secondary | ICD-10-CM | POA: Diagnosis not present

## 2022-09-29 DIAGNOSIS — R451 Restlessness and agitation: Secondary | ICD-10-CM

## 2022-09-29 DIAGNOSIS — F419 Anxiety disorder, unspecified: Secondary | ICD-10-CM | POA: Diagnosis not present

## 2022-09-29 MED ORDER — CLONAZEPAM 0.5 MG PO TABS: 0.5000 mg | ORAL_TABLET | Freq: Two times a day (BID) | ORAL | 1 refills | Status: DC | PRN

## 2022-09-29 MED ORDER — TRAZODONE HCL 50 MG PO TABS: 25.0000 mg | ORAL_TABLET | Freq: Every evening | ORAL | 1 refills | Status: DC | PRN

## 2022-09-29 NOTE — Patient Instructions (Signed)
I would like you to stop the Cymbalta since it is not providing much benefit  I would like you to try the lower dose of Klonopin and the Trazodone 1-2 tablets as needed before bed  Make sure you take it while at home and you are not driving or operating machinery
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
## 2022-09-30 DIAGNOSIS — F5105 Insomnia due to other mental disorder: Secondary | ICD-10-CM | POA: Insufficient documentation

## 2022-09-30 NOTE — Telephone Encounter (Signed)
Unable to refill per protocol, Rx request was refilled 09/29/22 for 20 and 1 rf. Will refuse duplicate request.  Requested Prescriptions  Pending Prescriptions Disp Refills  . clonazePAM (KLONOPIN) 1 MG tablet [Pharmacy Med Name: CLONAZEPAM 1 MG TABLET] 30 tablet 0    Sig: Take 1 tablet (1 mg total) by mouth daily.     Not Delegated - Psychiatry: Anxiolytics/Hypnotics 2 Failed - 09/29/2022  1:30 PM      Failed - This refill cannot be delegated      Failed - Urine Drug Screen completed in last 360 days      Passed - Patient is not pregnant      Passed - Valid encounter within last 6 months    Recent Outpatient Visits          Yesterday Anxiety with agitation   [facility] Mecum, Dani Gobble, PA-C   1 month ago Anxiety with agitation   [place], Dani Gobble, PA-C   4 months ago Otorrhagia of right [employer] V[facility], Avanti, MD   5 months ago Anxiety   [facility] Vigg, Avanti, MD   1 year ago [place], MD      Future Appointments            In 1 month Mecum, Dani Gobble, PA-C [employer]

## 2022-10-03 ENCOUNTER — Ambulatory Visit: Payer: Self-pay | Admitting: *Deleted

## 2022-10-03 NOTE — Telephone Encounter (Signed)
Returned patients call, he states that he wants his trazodone increased to taking 2-50 mg tabs. HE states that 1 does nothing for him and when he took 2 last night he had a full nights sleep. He did get irritated when I stated that he may have to come in for another appt to discuss this increase.  Please advise

## 2022-10-03 NOTE — Assessment & Plan Note (Signed)
Appears chronic and likely secondary to anxiety Will try adding Trazodone to regimen to improve sleep and anxiety  Follow up in 4-6 weeks to discuss response

## 2022-10-03 NOTE — Telephone Encounter (Signed)
Summary: discuss dosage increase   Pt states when taking traZODone (DESYREL) 50 MG tablet as prescribed it did not work   Pt states after taking 2 tablets he was able to sleep   Pt inquiring if dosage can be increased   Please assist further      Reason for Disposition  [1] Caller has URGENT medicine question about med that PCP or specialist prescribed AND [2] triager unable to answer question  Answer Assessment - Initial Assessment Questions 1. NAME of MEDICINE: "What medicine(s) are you calling about?"     Trazodone  2. QUESTION: "What is your question?" (e.g., double dose of medicine, side effect)     Can he continue with 2 pills /night 3. PRESCRIBER: "Who prescribed the medicine?" Reason: if prescribed by specialist, call should be referred to that group.     Mecum 4. SYMPTOMS: "Do you have any symptoms?" If Yes, ask: "What symptoms are you having?"  "How bad are the symptoms (e.g., mild, moderate, severe)     Trouble sleeping- patient states he felt tired with one pill- but did not sleep through the night. He took 2 pills last night and slept   Patient wants to know if he can increase his dose to 2 pills /night  Protocols used: Medication Question Call-A-AH

## 2022-10-03 NOTE — Telephone Encounter (Signed)
Pt returned our call. Shared provider's note.Pt will take 2 for a total of 100mg . Mecum, Erin E, PA-C to Jerelene Redden, CMA      10/03/22  1:11 PM He can go up to 2 tablets to equal 100 mg per night and just send a refill request when he is getting low. I'd still like to see him in 6 weeks to discuss how he's doing with the changes.

## 2022-10-03 NOTE — Assessment & Plan Note (Signed)
Chronic concern, ongoing and not well controlled  Is taking Clonazepam 1 mg PO QD PRN but reports this is not fully managing his symptoms  He also reports difficulty sleeping due to racing thoughts and anxiety  He did not like the way he felt while taking Cymbalta so he stopped taking that- will dc today Discussed sleep issues and potentially adding Trazodone to regimen to assist with mood and sleep Educated patient on connection between sleep and mental health to which he verbalized understanding  I am concerned that he may have undiagnosed PTSD due to childhood traumas  Offered referral to therapy services but patient declined- will continue to make this available if he should change mind Will try adding Trazodone 50 mg PO QHS- can take half or whole as desired  Will try reducing Clonazepam to 0.5 mg PO QD PRN - okay if he needs to take 2 for effect Patient voiced interest in trying to reduce Clonazepam use to only several times per week which I encouraged and think reducing dose and frequency may assist with this once appropriate controller is established Follow up in 4-6 weeks to assess response to Trazodone

## 2022-10-31 ENCOUNTER — Telehealth (INDEPENDENT_AMBULATORY_CARE_PROVIDER_SITE_OTHER): Payer: 59 | Admitting: Physician Assistant

## 2022-10-31 DIAGNOSIS — F419 Anxiety disorder, unspecified: Secondary | ICD-10-CM | POA: Diagnosis not present

## 2022-10-31 DIAGNOSIS — F5105 Insomnia due to other mental disorder: Secondary | ICD-10-CM

## 2022-10-31 DIAGNOSIS — F99 Mental disorder, not otherwise specified: Secondary | ICD-10-CM

## 2022-10-31 DIAGNOSIS — R451 Restlessness and agitation: Secondary | ICD-10-CM

## 2022-10-31 MED ORDER — CLONAZEPAM 1 MG PO TABS: 1.0000 mg | ORAL_TABLET | Freq: Every day | ORAL | 0 refills | Status: DC

## 2022-10-31 NOTE — Patient Instructions (Addendum)
Please resume taking the Klonopin 1 mg by mouth once per day You can stop the trazodone at this time and dispose of it at your local pharmacy   We can reconvene after the new year to discuss options for your anxiety and insomnia at that time  Please let us know if you have questions or concerns before then.

## 2022-10-31 NOTE — Progress Notes (Unsigned)
Virtual Visit via Video Note  I connected with Antonio Frank on 11/01/22 at  9:20 AM EST by a video enabled telemedicine application and verified that I am speaking with the correct person using two identifiers.   Today's Provider: Jacquelin Hawking, MHS, PA-C Introduced myself to the patient as a PA-C and provided education on APPs in clinical practice.    Location: Patient: at work, [place], [place]  Provider: [facility], [place], [place]    I discussed the limitations of evaluation and management by telemedicine and the availability of in person appointments. The patient expressed understanding and agreed to proceed.   Chief Complaint  Patient presents with   Anxiety    Patient doling 4 week follow up on anxiety. States he has stopped taking trazodone due to them giving him headaches.    History of Present Illness:   Reports ongoing anxiety  Past medications: Cymbalta- did not like how he felt on this, is taking Klonopin   Started trazodone at last apt - has stopped taking them due to headaches but reports  States he developed headaches and woke up in the AM feeling "hung-over"  Reports improvement in sleep while taking this but unsure if this benefited his anxiety  Reports headaches progressed to point where Exedrin Migraine was not beneficial  States he has been taking Klonopin for his anxiety but thinks he needs to go back to 1 mg PO QD Reports he tried taking one trazodone with one of his 0.5 mg Klonopin to help with sleep and also tried taking just 2 Trazodone before bed but reports continued headaches  He states he is still willing to try a new medication as needed   Reports anxiety is still there and has been chronic for years States he feels like the Klonopin 1 mg was more beneficial than the 0.5 mg that we started the last time      10/31/2022    9:31 AM 09/29/2022    2:11 PM 08/31/2022    9:45 AM 05/24/2022   10:16 AM  GAD 7 : Generalized Anxiety Score   Nervous, Anxious, on Edge 3 1 1 2   Control/stop worrying 3 1 3 2   Worry too much - different things 3 3 3 2   Trouble relaxing 3 1 1 1   Restless 1 0 1 0  Easily annoyed or irritable 1 1 3 1   Afraid - awful might happen 0 0 0 0  Total GAD 7 Score 14 7 12 8   Anxiety Difficulty   Not difficult at all Somewhat difficult       10/31/2022    9:30 AM 09/29/2022    2:11 PM 08/31/2022    9:43 AM 05/24/2022   10:16 AM 04/07/2022   10:28 AM  Depression screen PHQ 2/9  Decreased Interest 1 1 1 1  0  Down, Depressed, Hopeless 1 1 1 2 1   PHQ - 2 Score 2 2 2 3 1   Altered sleeping 3 1 1  0 3  Tired, decreased energy 1 1 0 0 2  Change in appetite 0 0 0 0 1  Feeling bad or failure about yourself  1 1 0 1 1  Trouble concentrating 0 0 0 1 0  Moving slowly or fidgety/restless 0 0 0 0 0  Suicidal thoughts 1 0 0 0 0  PHQ-9 Score 8 5 3 5 8   Difficult doing work/chores   Not difficult at all Somewhat difficult Somewhat difficult  Review of Systems  Psychiatric/Behavioral:  Negative for substance abuse. The patient is nervous/anxious and has insomnia.        Observations/Objective:   Due to the nature of the virtual visit, physical exam and observations are limited. Able to obtain the following observations:  Alert, oriented, Appears comfortable, in no acute distress.  No scleral injection, no appreciated hoarseness, tachypnea, wheeze or strider. Able to maintain conversation without visible strain.  No cough appreciated during visit.    Assessment and Plan: Problem List Items Addressed This Visit       Other   Anxiety with agitation    Chronic, ongoing concern Reports anxiety was not much improved with addition of Trazodone as side effects did not allow him to take consistently Reports anxiety was not well managed on Clonazepam 0.5 mg PO QD PRN  Will return to Clonazepam 1.0 mg PO QD PRN for now as this seems to provide most benefit for him  He is still open to trying other  medications and a controller medication but I think we should take a break at this time from adding new medications  Will try to start a new controller at next follow up if he is amenable to this  Follow up in 2 months to discuss regimen changes        Relevant Medications   clonazePAM (KLONOPIN) 1 MG tablet   Insomnia due to other mental disorder - Primary    Chronic, ongoing Reports Trazodone did help with sleep and he felt more rested but woke up with severe headaches after taking for several nights in a row  Recommend he stop Trazodone at this time and we will resume Clonazepam 1.0 mg PO QD PRN to assist with anxiety and associated insomnia We discussed potentially adding a different hypnotic to regimen but I think he would benefit from a break as we have tried and failed several medications over the last few months  Recommend follow up in 2 months to discuss new insomnia medications and anxiolytics        Follow Up Instructions:    I discussed the assessment and treatment plan with the patient. The patient was provided an opportunity to ask questions and all were answered. The patient agreed with the plan and demonstrated an understanding of the instructions.   The patient was advised to call back or seek an in-person evaluation if the symptoms worsen or if the condition fails to improve as anticipated.  I provided 19 minutes of non-face-to-face time during this encounter.  Return in about 2 months (around 12/31/2022) for anxiety, insomnia follow up .   I, Mayreli Alden E Izreal Kock, PA-C, have reviewed all documentation for this visit. The documentation on 11/01/22 for the exam, diagnosis, procedures, and orders are all accurate and complete.   Jacquelin Hawking, MHS, PA-C [facility]

## 2022-11-01 NOTE — Assessment & Plan Note (Signed)
Chronic, ongoing concern Reports anxiety was not much improved with addition of Trazodone as side effects did not allow him to take consistently Reports anxiety was not well managed on Clonazepam 0.5 mg PO QD PRN  Will return to Clonazepam 1.0 mg PO QD PRN for now as this seems to provide most benefit for him  He is still open to trying other medications and a controller medication but I think we should take a break at this time from adding new medications  Will try to start a new controller at next follow up if he is amenable to this  Follow up in 2 months to discuss regimen changes

## 2022-11-01 NOTE — Assessment & Plan Note (Signed)
Chronic, ongoing Reports Trazodone did help with sleep and he felt more rested but woke up with severe headaches after taking for several nights in a row  Recommend he stop Trazodone at this time and we will resume Clonazepam 1.0 mg PO QD PRN to assist with anxiety and associated insomnia We discussed potentially adding a different hypnotic to regimen but I think he would benefit from a break as we have tried and failed several medications over the last few months  Recommend follow up in 2 months to discuss new insomnia medications and anxiolytics

## 2022-11-10 ENCOUNTER — Telehealth: Payer: 59 | Admitting: Physician Assistant

## 2022-12-08 ENCOUNTER — Telehealth: Payer: Self-pay | Admitting: Nurse Practitioner

## 2022-12-08 NOTE — Telephone Encounter (Signed)
Medication Refill - Medication: clonazePAM (KLONOPIN) 1 MG tablet [[id number]]  ENDED   Has the patient contacted their pharmacy? Yes.   (Agent: If no, request that the patient contact the pharmacy for the refill. If patient does not wish to contact the pharmacy document the reason why and proceed with request.) (Agent: If yes, when and what did the pharmacy advise?)  Preferred Pharmacy (with phone number or street name): [facility], [address], [place] [address] Phone: [phone]  Fax: [phone]  Has the patient been seen for an appointment in the last year OR does the patient have an upcoming appointment? Yes.    Agent: Please be advised that RX refills may take up to 3 business days. We ask that you follow-up with your pharmacy.

## 2022-12-09 ENCOUNTER — Other Ambulatory Visit: Payer: Self-pay | Admitting: Physician Assistant

## 2022-12-09 DIAGNOSIS — R451 Restlessness and agitation: Secondary | ICD-10-CM

## 2022-12-09 NOTE — Telephone Encounter (Signed)
Requested medication (s) are due for refill today -yes  Requested medication (s) are on the active medication list -yes  Future visit scheduled -no  Last refill: 10/31/22 #30  Notes to clinic: non delegated Rx  Requested Prescriptions  Pending Prescriptions Disp Refills   clonazePAM (KLONOPIN) 1 MG tablet [Pharmacy Med Name: CLONAZEPAM 1 MG TABLET] 30 tablet 0    Sig: Take 1 tablet (1 mg total) by mouth daily.     Not Delegated - Psychiatry: Anxiolytics/Hypnotics 2 Failed - 12/09/2022 12:35 PM      Failed - This refill cannot be delegated      Failed - Urine Drug Screen completed in last 360 days      Passed - Patient is not pregnant      Passed - Valid encounter within last 6 months    Recent Outpatient Visits           1 month ago Insomnia due to other mental disorder   [facility] Mecum, Dani Gobble, PA-C   2 months ago Anxiety with agitation   [facility] Mecum, Dani Gobble, PA-C   3 months ago Anxiety with agitation   [facility] Mecum, Dani Gobble, PA-C   6 months ago Otorrhagia of right ear   [place], Avanti, MD   8 months ago [place], MD                 Requested Prescriptions  Pending Prescriptions Disp Refills   clonazePAM (KLONOPIN) 1 MG tablet [Pharmacy Med Name: CLONAZEPAM 1 MG TABLET] 30 tablet 0    Sig: Take 1 tablet (1 mg total) by mouth daily.     Not Delegated - Psychiatry: Anxiolytics/Hypnotics 2 Failed - 12/09/2022 12:35 PM      Failed - This refill cannot be delegated      Failed - Urine Drug Screen completed in last 360 days      Passed - Patient is not pregnant      Passed - Valid encounter within last 6 months    Recent Outpatient Visits           1 month ago Insomnia due to other mental disorder   [facility] Mecum, Dani Gobble, PA-C   2 months ago Anxiety with agitation   [place], Dani Gobble, PA-C   3 months ago  Anxiety with agitation   [place], Dani Gobble, PA-C   6 months ago Otorrhagia of right [employer]support[facility]administrative [facility] Vigg, Avanti, MD   8 months ago Anxiety   [facility] Vigg, Avanti, MD

## 2023-02-09 ENCOUNTER — Telehealth: Payer: Self-pay | Admitting: Nurse Practitioner

## 2023-02-09 NOTE — Telephone Encounter (Signed)
Patient is overdue for a follow up. Please call to schedule appointment.

## 2023-02-09 NOTE — Telephone Encounter (Signed)
Medication Refill - Medication: clonazePAM (KLONOPIN) 1 MG tablet   Has the patient contacted their pharmacy? No. (Agent: If no, request that the patient contact the pharmacy for the refill. If patient does not wish to contact the pharmacy document the reason why and proceed with request.) (Agent: If yes, when and what did the pharmacy advise?)  Preferred Pharmacy (with phone number or street name):  [facility], [place] Phone: [phone]  Fax: [phone]     Has the patient been seen for an appointment in the last year OR does the patient have an upcoming appointment? Yes.    Agent: Please be advised that RX refills may take up to 3 business days. We ask that you follow-up with your pharmacy.

## 2023-02-09 NOTE — Telephone Encounter (Signed)
Called the pt and he stated that he will call back to get scheduled for his Annual Physical.

## 2023-02-10 NOTE — Telephone Encounter (Signed)
When the patient was seen in November, we was advised to follow up in 2 months for a follow up appointment which should have been the end of January.

## 2023-02-10 NOTE — Telephone Encounter (Signed)
Patient has to be seen every 3 months if he would like to continue on Clonazepam.

## 2023-02-10 NOTE — Telephone Encounter (Signed)
Patient wants to know why this medication can't be prescribed. Pt says he was seen in November and doesn't understand why he needs to be seen again. Pt wants to know is the physical stopping the prescription? Please follow up with pt regarding the medication.

## 2023-02-10 NOTE — Telephone Encounter (Signed)
Called patient to advise him to schedule an appointment in order to receive his meds.  He just starting venting saying how it was a money racket to just be seen every couple of months to get meds filled.  I explained to him that in the medical world for a controlled substance you normally have to be seen every 3 months and that there were laws behind medications as such.  He then went on to talking about alcohol not being against the law but no one says anything about that.  He told me to disregard the request as he is tired of coming in just to get the meds when he just seen her in November.  He stated that he was just going to do without them, he said he had to learn how to at some point.  I told him that I am sorry for him feeling this way and I understand the frustration.  However he was still refusing to make the appointment and adamant that he was just going to stop taking the meds.  Went on to saying that it wasn't my fault and for me to have a good day.

## 2023-02-13 NOTE — Telephone Encounter (Signed)
Called and spoke to patient. He states he just does not want to schedule appointment. States he does not feel that he should have to be seen ever 3 months to get his medication. Patient states that he has been weaning himself off of the medication anyway because it does not help him that much anymore. Advised patient to please give [place] a call if he would like to schedule an appointment.

## 2023-04-06 ENCOUNTER — Encounter: Payer: Self-pay | Admitting: Nurse Practitioner

## 2023-04-06 ENCOUNTER — Ambulatory Visit: Payer: 59 | Admitting: Nurse Practitioner

## 2023-04-06 VITALS — BP 115/70 | HR 58 | Temp 97.8°F | Wt 195.4 lb

## 2023-04-06 DIAGNOSIS — R451 Restlessness and agitation: Secondary | ICD-10-CM

## 2023-04-06 DIAGNOSIS — F419 Anxiety disorder, unspecified: Secondary | ICD-10-CM | POA: Diagnosis not present

## 2023-04-06 MED ORDER — CLONAZEPAM 1 MG PO TABS: 1.0000 mg | ORAL_TABLET | Freq: Every day | ORAL | 2 refills | Status: DC

## 2023-04-06 NOTE — Progress Notes (Signed)
BP 115/70   Pulse (!) 58   Temp 97.8 F (36.6 C) (Oral)   Wt 195 lb 6.4 oz (88.6 kg)   SpO2 98%   BMI 28.04 kg/m    Subjective:    Patient ID: Antonio Frank, male    DOB: March 26, 1971, 52 y.o.   MRN: [id number]  HPI: Antonio Frank is a 52 y.o. male  Chief Complaint  Patient presents with   Anxiety   Insomnia   ANXIETY/INSOMNIA Patient states he has been taking Klonopin sometimes once daily and sometimes once every 3-4 days.  He does not use it to sleep.  He wanted to go without the medication but felt like his anxiety was worsening and would like to restart the medication.   Relevant past medical, surgical, family and social history reviewed and updated as indicated. Interim medical history since our last visit reviewed. Allergies and medications reviewed and updated.  Review of Systems  Psychiatric/Behavioral:  Negative for suicidal ideas. The patient is nervous/anxious.     Per HPI unless specifically indicated above     Objective:    BP 115/70   Pulse (!) 58   Temp 97.8 F (36.6 C) (Oral)   Wt 195 lb 6.4 oz (88.6 kg)   SpO2 98%   BMI 28.04 kg/m   Wt Readings from Last 3 Encounters:  04/06/23 195 lb 6.4 oz (88.6 kg)  09/29/22 190 lb 1.6 oz (86.2 kg)  05/24/22 201 lb (91.2 kg)    Physical Exam Vitals and nursing note reviewed.  Constitutional:      General: He is not in acute distress.    Appearance: Normal appearance. He is not ill-appearing, toxic-appearing or diaphoretic.  HENT:     Head: Normocephalic.     Right Ear: External ear normal.     Left Ear: External ear normal.     Nose: Nose normal. No congestion or rhinorrhea.     Mouth/Throat:     Mouth: Mucous membranes are moist.  Eyes:     General:        Right eye: No discharge.        Left eye: No discharge.     Extraocular Movements: Extraocular movements intact.     Conjunctiva/sclera: Conjunctivae normal.     Pupils: Pupils are equal, round, and reactive to light.  Cardiovascular:      Rate and Rhythm: Normal rate and regular rhythm.     Heart sounds: No murmur heard. Pulmonary:     Effort: Pulmonary effort is normal. No respiratory distress.     Breath sounds: Normal breath sounds. No wheezing, rhonchi or rales.  Abdominal:     General: Abdomen is flat. Bowel sounds are normal.  Musculoskeletal:     Cervical back: Normal range of motion and neck supple.  Skin:    General: Skin is warm and dry.     Capillary Refill: Capillary refill takes less than 2 seconds.  Neurological:     General: No focal deficit present.     Mental Status: He is alert and oriented to person, place, and time.  Psychiatric:        Mood and Affect: Mood normal.        Behavior: Behavior normal.        Thought Content: Thought content normal.        Judgment: Judgment normal.     Results for orders placed or performed in visit on 04/07/22  CBC with Differential/Platelet  Result Value Ref  Range   WBC 6.7 3.4 - 10.8 x10E3/uL   RBC 5.01 4.14 - 5.80 x10E6/uL   Hemoglobin 15.9 13.0 - 17.7 g/dL   Hematocrit 16.1 09.6 - 51.0 %   MCV 95 79 - 97 fL   MCH 31.7 26.6 - 33.0 pg   MCHC 33.5 31.5 - 35.7 g/dL   RDW 04.5 (L) 40.9 - 81.1 %   Platelets 356 150 - 450 x10E3/uL   Neutrophils 55 Not Estab. %   Lymphs 32 Not Estab. %   Monocytes 8 Not Estab. %   Eos 4 Not Estab. %   Basos 1 Not Estab. %   Neutrophils Absolute 3.7 1.4 - 7.0 x10E3/uL   Lymphocytes Absolute 2.1 0.7 - 3.1 x10E3/uL   Monocytes Absolute 0.5 0.1 - 0.9 x10E3/uL   EOS (ABSOLUTE) 0.3 0.0 - 0.4 x10E3/uL   Basophils Absolute 0.1 0.0 - 0.2 x10E3/uL   Immature Granulocytes 0 Not Estab. %   Immature Grans (Abs) 0.0 0.0 - 0.1 x10E3/uL  Comprehensive metabolic panel  Result Value Ref Range   Glucose 97 70 - 99 mg/dL   BUN 19 6 - 24 mg/dL   Creatinine, Ser 9.14 0.76 - 1.27 mg/dL   eGFR 77 >78 GN/FAO/1.30   BUN/Creatinine Ratio 17 9 - 20   Sodium 139 134 - 144 mmol/L   Potassium 4.7 3.5 - 5.2 mmol/L   Chloride 104 96 - 106 mmol/L    CO2 18 (L) 20 - 29 mmol/L   Calcium 9.8 8.7 - 10.2 mg/dL   Total Protein 6.8 6.0 - 8.5 g/dL   Albumin 4.6 3.8 - 4.9 g/dL   Globulin, Total 2.2 1.5 - 4.5 g/dL   Albumin/Globulin Ratio 2.1 1.2 - 2.2   Bilirubin Total 0.4 0.0 - 1.2 mg/dL   Alkaline Phosphatase 80 44 - 121 IU/L   AST 24 0 - 40 IU/L   ALT 39 0 - 44 IU/L  Urinalysis, Routine w reflex microscopic  Result Value Ref Range   Specific Gravity, UA 1.025 1.005 - 1.030   pH, UA 6.0 5.0 - 7.5   Color, UA Yellow Yellow   Appearance Ur Clear Clear   Leukocytes,UA Negative Negative   Protein,UA Negative Negative/Trace   Glucose, UA Negative Negative   Ketones, UA Negative Negative   RBC, UA Negative Negative   Bilirubin, UA Negative Negative   Urobilinogen, Ur 0.2 0.2 - 1.0 mg/dL   Nitrite, UA Negative Negative  TSH  Result Value Ref Range   TSH 0.703 0.450 - 4.500 uIU/mL      Assessment & Plan:   Problem List Items Addressed This Visit       Other   Anxiety with agitation - Primary    Chronic.  Controlled with Klonopin.  UDS and controlled substance agreement obtained during visit today.  Patient is aware that he will need to follow up every 3 months for refills.  PDMP checked.  Call sooner if concerns arise.       Relevant Medications   clonazePAM (KLONOPIN) 1 MG tablet   Other Relevant Orders   [id number] 11+Oxyco+Alc+Crt-Bund     Follow up plan: Return in about 3 months (around 07/07/2023) for Medication Management.

## 2023-04-06 NOTE — Assessment & Plan Note (Signed)
Chronic.  Controlled with Klonopin.  UDS and controlled substance agreement obtained during visit today.  Patient is aware that he will need to follow up every 3 months for refills.  PDMP checked.  Call sooner if concerns arise.

## 2023-04-07 LAB — DRUG SCREEN 764883 11+OXYCO+ALC+CRT-BUND
Amphetamines, Urine: NEGATIVE ng/mL
BENZODIAZ UR QL: NEGATIVE ng/mL
Barbiturate: NEGATIVE ng/mL
Cannabinoid Quant, Ur: NEGATIVE ng/mL
Cocaine (Metabolite): NEGATIVE ng/mL
Creatinine: 60.7 mg/dL (ref 20.0–300.0)
Ethanol: NEGATIVE %
Meperidine: NEGATIVE ng/mL
Methadone Screen, Urine: NEGATIVE ng/mL
OPIATE SCREEN URINE: NEGATIVE ng/mL
Oxycodone/Oxymorphone, Urine: NEGATIVE ng/mL
Phencyclidine: NEGATIVE ng/mL
Propoxyphene: NEGATIVE ng/mL
Tramadol: NEGATIVE ng/mL
pH, Urine: 5.9 (ref 4.5–8.9)

## 2023-07-07 ENCOUNTER — Encounter: Payer: Self-pay | Admitting: Family Medicine

## 2023-07-07 ENCOUNTER — Ambulatory Visit: Payer: 59 | Admitting: Family Medicine

## 2023-07-07 VITALS — BP 110/71 | HR 61 | Wt 190.8 lb

## 2023-07-07 DIAGNOSIS — R451 Restlessness and agitation: Secondary | ICD-10-CM

## 2023-07-07 DIAGNOSIS — F419 Anxiety disorder, unspecified: Secondary | ICD-10-CM

## 2023-07-07 MED ORDER — CLONAZEPAM 1 MG PO TABS: 1.0000 mg | ORAL_TABLET | Freq: Every day | ORAL | 2 refills | Status: DC

## 2023-07-07 MED ORDER — FLUOXETINE HCL 10 MG PO TABS: 10.0000 mg | ORAL_TABLET | Freq: Every day | ORAL | 0 refills | Status: DC

## 2023-07-07 NOTE — Progress Notes (Signed)
BP 110/71   Pulse 61   Wt 190 lb 12.8 oz (86.5 kg)   SpO2 97%   BMI 27.38 kg/m    Subjective:    Patient ID: Antonio Frank, male    DOB: Dec 31, 1970, 52 y.o.   MRN: [id number]  HPI: Antonio Frank is a 52 y.o. male  Chief Complaint  Patient presents with   Medication Management   Anxiety   ANXIETY/INSOMNIA He refuses options for counseling today. Patient states he has been taking Klonopin once daily due to increased stress levels. He has previously taken Celexa, Cymbalta, and Paxil, which patient states they never worked for him.  Duration:uncontrolled Anxious mood: no  Excessive worrying: yes Irritability: yes  Sweating: no Nausea: no Palpitations:yes drinks a lot of coffee Hyperventilation: no Panic attacks: yes but was able to talk himself down and out of it.  Agoraphobia: no  Obscessions/compulsions: no Depressed mood: yes    07/07/2023    9:03 AM 04/06/2023   11:30 AM 10/31/2022    9:30 AM 09/29/2022    2:11 PM 08/31/2022    9:43 AM  Depression screen PHQ 2/9  Decreased Interest 1 1 1 1 1   Down, Depressed, Hopeless 2 1 1 1 1   PHQ - 2 Score 3 2 2 2 2   Altered sleeping 1 1 3 1 1   Tired, decreased energy 1 0 1 1 0  Change in appetite 1 0 0 0 0  Feeling bad or failure about yourself  2 0 1 1 0  Trouble concentrating 1 0 0 0 0  Moving slowly or fidgety/restless 0 0 0 0 0  Suicidal thoughts 0 0 1 0 0  PHQ-9 Score 9 3 8 5 3   Difficult doing work/chores Not difficult at all Somewhat difficult   Not difficult at all      07/07/2023    9:04 AM 04/06/2023   11:30 AM 10/31/2022    9:31 AM 09/29/2022    2:11 PM  GAD 7 : Generalized Anxiety Score  Nervous, Anxious, on Edge 1 3 3 1   Control/stop worrying 2 3 3 1   Worry too much - different things 1 3 3 3   Trouble relaxing 1 3 3 1   Restless 1 3 1  0  Easily annoyed or irritable 1 2 1 1   Afraid - awful might happen 0 1 0 0  Total GAD 7 Score 7 18 14 7   Anxiety Difficulty Not difficult at all Somewhat difficult      Anhedonia: no Weight changes: no Insomnia: no   Hypersomnia: no Fatigue/loss of energy: yes Feelings of worthlessness: yes Feelings of guilt: yes Impaired concentration/indecisiveness: no Suicidal ideations: yes thought of it, but does not plan to do this Crying spells: yes Recent Stressors/Life Changes: yes   Relationship problems: yes   Family stress: yes     Financial stress: yes    Job stress: yes    Recent death/loss: Yes, friend  Relevant past medical, surgical, family and social history reviewed and updated as indicated. Interim medical history since our last visit reviewed. Allergies and medications reviewed and updated.  Review of Systems  Respiratory: Negative.    Cardiovascular: Negative.   Gastrointestinal: Negative.   Psychiatric/Behavioral:  Positive for agitation and dysphoric mood. Negative for behavioral problems, confusion, decreased concentration, self-injury, sleep disturbance and suicidal ideas. The patient is nervous/anxious.     Per HPI unless specifically indicated above     Objective:    BP 110/71   Pulse 61  Wt 190 lb 12.8 oz (86.5 kg)   SpO2 97%   BMI 27.38 kg/m   Wt Readings from Last 3 Encounters:  07/07/23 190 lb 12.8 oz (86.5 kg)  04/06/23 195 lb 6.4 oz (88.6 kg)  09/29/22 190 lb 1.6 oz (86.2 kg)    Physical Exam Vitals and nursing note reviewed.  Constitutional:      General: He is not in acute distress.    Appearance: Normal appearance. He is not ill-appearing, toxic-appearing or diaphoretic.  HENT:     Head: Normocephalic.     Right Ear: External ear normal.     Left Ear: External ear normal.     Nose: Nose normal. No congestion or rhinorrhea.     Mouth/Throat:     Mouth: Mucous membranes are moist.  Eyes:     General:        Right eye: No discharge.        Left eye: No discharge.     Extraocular Movements: Extraocular movements intact.     Conjunctiva/sclera: Conjunctivae normal.     Pupils: Pupils are equal, round,  and reactive to light.  Cardiovascular:     Rate and Rhythm: Normal rate and regular rhythm.     Pulses:          Radial pulses are 2+ on the right side and 2+ on the left side.       Posterior tibial pulses are 2+ on the right side and 2+ on the left side.     Heart sounds: No murmur heard. Pulmonary:     Effort: Pulmonary effort is normal. No respiratory distress.     Breath sounds: Normal breath sounds. No wheezing, rhonchi or rales.  Abdominal:     General: Abdomen is flat. Bowel sounds are normal.  Musculoskeletal:     Cervical back: Normal range of motion and neck supple.  Skin:    General: Skin is warm and dry.     Capillary Refill: Capillary refill takes less than 2 seconds.  Neurological:     General: No focal deficit present.     Mental Status: He is alert and oriented to person, place, and time.  Psychiatric:        Mood and Affect: Mood is anxious and depressed.        Speech: Speech normal.        Behavior: Behavior is agitated.        Thought Content: Thought content normal.        Judgment: Judgment normal.     Results for orders placed or performed in visit on 04/06/23  [id number] 11+Oxyco+Alc+Crt-Bund  Result Value Ref Range   Ethanol Negative Cutoff=0.020 %   Amphetamines, Urine Negative Cutoff=1000 ng/mL   Barbiturate Negative Cutoff=200 ng/mL   BENZODIAZ UR QL Negative Cutoff=200 ng/mL   Cannabinoid Quant, Ur Negative Cutoff=50 ng/mL   Cocaine (Metabolite) Negative Cutoff=300 ng/mL   OPIATE SCREEN URINE Negative Cutoff=300 ng/mL   Oxycodone/Oxymorphone, Urine Negative Cutoff=300 ng/mL   Phencyclidine Negative Cutoff=25 ng/mL   Methadone Screen, Urine Negative Cutoff=300 ng/mL   Propoxyphene Negative Cutoff=300 ng/mL   Meperidine Negative Cutoff=200 ng/mL   Tramadol Negative Cutoff=200 ng/mL   Creatinine 60.7 20.0 - 300.0 mg/dL   pH, Urine 5.9 4.5 - 8.9      Assessment & Plan:   Problem List Items Addressed This Visit     Anxiety with agitation -  Primary    Chronic.  Controlled with Klonopin. Start Prozac 10 mg daily  and continue Klonopin as needed. Gene sight testing sample done today, awaiting results, will treat based on results.  UDS and controlled substance agreement on file from previous visit. Recommend follow up in 1 month, will consider increasing Prozac dose based on mood.  PDMP checked.  Call sooner if concerns arise.       Relevant Medications   FLUoxetine (PROZAC) 10 MG tablet   clonazePAM (KLONOPIN) 1 MG tablet      Follow up plan: Return in about 5 weeks (around 08/11/2023).

## 2023-07-07 NOTE — Assessment & Plan Note (Signed)
Chronic.  Controlled with Klonopin. Start Prozac 10 mg daily and continue Klonopin as needed. Gene sight testing sample done today, awaiting results, will treat based on results.  UDS and controlled substance agreement on file from previous visit. Recommend follow up in 1 month, will consider increasing Prozac dose based on mood.  PDMP checked.  Call sooner if concerns arise.

## 2023-07-07 NOTE — Patient Instructions (Addendum)
Take Prozac daily

## 2023-07-11 ENCOUNTER — Telehealth: Payer: Self-pay | Admitting: Family Medicine

## 2023-07-11 MED ORDER — DESVENLAFAXINE SUCCINATE ER 25 MG PO TB24: 25.0000 mg | ORAL_TABLET | Freq: Every day | ORAL | 1 refills | Status: DC

## 2023-07-11 NOTE — Addendum Note (Signed)
Addended by: Prescott Gum on: 07/11/2023 02:15 PM   Modules accepted: Orders

## 2023-07-11 NOTE — Telephone Encounter (Signed)
Hi Britt, Can you let Antonio Frank know his gene sight results have returned. I forwarded them to his email address. It is recommended that he stop taking the Prozac and start taking Pristiq 25 mg for one month and we will follow up on how he's doing at his one month follow up. I have sent the prescription to his pharmacy already. There were moderate gene drug interactions showing for the Prozac and none showing for the Pristiq (Desvenlafaxine). Thank you.

## 2023-07-11 NOTE — Telephone Encounter (Signed)
Called and LVM asking for patient to please return my call to discuss.

## 2023-07-12 NOTE — Telephone Encounter (Signed)
Patient returned called to office. Please return call to patient when available to discuss.

## 2023-07-12 NOTE — Telephone Encounter (Signed)
Called and LVM asking for patient to please return my call to discuss.

## 2023-07-12 NOTE — Telephone Encounter (Signed)
Patient returned my call and was notified of Rashelle's message. Patient verbalized understanding but wanted to let Rashelle know he is hesitant about switching medication. States he is going to try the Pristiq but is nervous about it. Advised patient to please call us if he has any issues or side effects with the medication.

## 2023-07-13 ENCOUNTER — Encounter: Payer: Self-pay | Admitting: Family Medicine

## 2023-08-11 ENCOUNTER — Ambulatory Visit: Payer: 59 | Admitting: Family Medicine

## 2023-08-11 VITALS — BP 109/75 | HR 63 | Wt 193.0 lb

## 2023-08-11 DIAGNOSIS — R451 Restlessness and agitation: Secondary | ICD-10-CM | POA: Diagnosis not present

## 2023-08-11 DIAGNOSIS — F419 Anxiety disorder, unspecified: Secondary | ICD-10-CM | POA: Diagnosis not present

## 2023-08-11 MED ORDER — CLONAZEPAM 1 MG PO TABS: 1.0000 mg | ORAL_TABLET | Freq: Every day | ORAL | 1 refills | Status: DC

## 2023-08-11 NOTE — Patient Instructions (Signed)
Take Zyrtec for allergies for 1 week daily  if no improvement switch to Xyzal

## 2023-08-11 NOTE — Progress Notes (Signed)
BP 109/75   Pulse 63   Wt 193 lb (87.5 kg)   BMI 27.69 kg/m    Subjective:    Patient ID: Antonio Frank, male    DOB: 05/30/1971, 52 y.o.   MRN: [id number]  HPI: Antonio Frank is a 52 y.o. male  Chief Complaint  Patient presents with   Anxiety   He states allergies are acting up. He has tried Claritin before but states has not worked, he is not currently taking anything for this. Will give Zyrtec samples today.  His caffeine intake consist of x4 cups daily, started having increased amount of headaches since this;   ANXIETY/STRESS GAD: 4, PhQ9: 4 He started taking Pristiq 25 MG for 8 days on 07/11/2023 and states it started making him feel light headed with difficulty concentrating, and making his blood pressure go up, was not able to check his blood pressure. He states the medication made him "not feel good". He states these symptoms went away after he stopped taking it. He is taking Klonopin 3x-4X weekly after work, improved since last visit as was taking this daily.   Duration:controlled Anxious mood: yes high and low at different times, aggravated with allergies Excessive worrying: yes Irritability: yes  Sweating: no Nausea: no Palpitations: No Hyperventilation: no Panic attacks: Yes but improved Agoraphobia: no  Obscessions/compulsions: no Depressed mood: yes    08/11/2023   10:53 AM 07/07/2023    9:03 AM 04/06/2023   11:30 AM 10/31/2022    9:30 AM 09/29/2022    2:11 PM  Depression screen PHQ 2/9  Decreased Interest 1 1 1 1 1   Down, Depressed, Hopeless 1 2 1 1 1   PHQ - 2 Score 2 3 2 2 2   Altered sleeping 1 1 1 3 1   Tired, decreased energy 0 1 0 1 1  Change in appetite 0 1 0 0 0  Feeling bad or failure about yourself  1 2 0 1 1  Trouble concentrating 0 1 0 0 0  Moving slowly or fidgety/restless 0 0 0 0 0  Suicidal thoughts 0 0 0 1 0  PHQ-9 Score 4 9 3 8 5   Difficult doing work/chores Not difficult at all Not difficult at all Somewhat difficult     Anhedonia:  no Weight changes: no Insomnia: no   Hypersomnia: no Fatigue/loss of energy: no Feelings of worthlessness: yes Feelings of guilt: no Impaired concentration/indecisiveness: no Suicidal ideations: no  Crying spells: no Recent Stressors/Life Changes: yes   Relationship problems: yes   Family stress: yes     Financial stress: yes    Job stress: yes    Recent death/loss: yes   Relevant past medical, surgical, family and social history reviewed and updated as indicated. Interim medical history since our last visit reviewed. Allergies and medications reviewed and updated.  Review of Systems  Respiratory: Negative.    Cardiovascular: Negative.   Gastrointestinal: Negative.   Psychiatric/Behavioral:  Positive for agitation and dysphoric mood. Negative for behavioral problems, confusion, decreased concentration, self-injury, sleep disturbance and suicidal ideas. The patient is nervous/anxious.     Per HPI unless specifically indicated above     Objective:    BP 109/75   Pulse 63   Wt 193 lb (87.5 kg)   BMI 27.69 kg/m   Wt Readings from Last 3 Encounters:  08/11/23 193 lb (87.5 kg)  07/07/23 190 lb 12.8 oz (86.5 kg)  04/06/23 195 lb 6.4 oz (88.6 kg)    Physical Exam Vitals  and nursing note reviewed.  Constitutional:      General: He is not in acute distress.    Appearance: Normal appearance. He is not ill-appearing, toxic-appearing or diaphoretic.  HENT:     Head: Normocephalic.     Right Ear: External ear normal.     Left Ear: External ear normal.     Nose: Nose normal. No congestion or rhinorrhea.     Mouth/Throat:     Mouth: Mucous membranes are moist.  Eyes:     General:        Right eye: No discharge.        Left eye: No discharge.     Extraocular Movements: Extraocular movements intact.     Conjunctiva/sclera: Conjunctivae normal.     Pupils: Pupils are equal, round, and reactive to light.  Cardiovascular:     Rate and Rhythm: Normal rate and regular rhythm.      Pulses:          Radial pulses are 2+ on the right side and 2+ on the left side.       Posterior tibial pulses are 2+ on the right side and 2+ on the left side.     Heart sounds: No murmur heard. Pulmonary:     Effort: Pulmonary effort is normal. No respiratory distress.     Breath sounds: Normal breath sounds. No wheezing, rhonchi or rales.  Abdominal:     General: Abdomen is flat. Bowel sounds are normal.  Musculoskeletal:     Cervical back: Normal range of motion and neck supple.  Skin:    General: Skin is warm and dry.     Capillary Refill: Capillary refill takes less than 2 seconds.  Neurological:     General: No focal deficit present.     Mental Status: He is alert and oriented to person, place, and time.  Psychiatric:        Mood and Affect: Mood is anxious and depressed.        Speech: Speech normal.        Behavior: Behavior is agitated.        Thought Content: Thought content normal.        Judgment: Judgment normal.     Results for orders placed or performed in visit on 04/06/23  [id number] 11+Oxyco+Alc+Crt-Bund  Result Value Ref Range   Ethanol Negative Cutoff=0.020 %   Amphetamines, Urine Negative Cutoff=1000 ng/mL   Barbiturate Negative Cutoff=200 ng/mL   BENZODIAZ UR QL Negative Cutoff=200 ng/mL   Cannabinoid Quant, Ur Negative Cutoff=50 ng/mL   Cocaine (Metabolite) Negative Cutoff=300 ng/mL   OPIATE SCREEN URINE Negative Cutoff=300 ng/mL   Oxycodone/Oxymorphone, Urine Negative Cutoff=300 ng/mL   Phencyclidine Negative Cutoff=25 ng/mL   Methadone Screen, Urine Negative Cutoff=300 ng/mL   Propoxyphene Negative Cutoff=300 ng/mL   Meperidine Negative Cutoff=200 ng/mL   Tramadol Negative Cutoff=200 ng/mL   Creatinine 60.7 20.0 - 300.0 mg/dL   pH, Urine 5.9 4.5 - 8.9      Assessment & Plan:   Problem List Items Addressed This Visit     Anxiety with agitation - Primary    Chronic. Controlled with 1MG  Klonopin daily, refills sent for 2 months. Discontinued  Pristiq 25 MG which is a preferred medication for him according to genesight, patient did not respond well to this. Will refer to psychiatry today for further management. UDS on file, PDMP checked. Call sooner if concerns arise.       Relevant Orders   Ambulatory referral to Psychiatry  Follow up plan: Return in about 3 months (around 11/10/2023) for Follow up mood.

## 2023-08-11 NOTE — Assessment & Plan Note (Addendum)
Chronic. Controlled with 1MG  Klonopin daily, refills sent for 2 months. Discontinued Pristiq 25 MG which is a preferred medication for him according to genesight, patient did not respond well to this. Will refer to psychiatry today for further management. UDS on file, PDMP checked. Call sooner if concerns arise.

## 2023-11-14 ENCOUNTER — Ambulatory Visit: Payer: 59 | Admitting: Nurse Practitioner

## 2023-11-14 ENCOUNTER — Encounter: Payer: Self-pay | Admitting: Nurse Practitioner

## 2023-11-14 VITALS — BP 132/85 | HR 66 | Temp 98.0°F | Ht 70.0 in | Wt 198.2 lb

## 2023-11-14 DIAGNOSIS — I10 Essential (primary) hypertension: Secondary | ICD-10-CM | POA: Diagnosis not present

## 2023-11-14 DIAGNOSIS — R451 Restlessness and agitation: Secondary | ICD-10-CM

## 2023-11-14 DIAGNOSIS — Z136 Encounter for screening for cardiovascular disorders: Secondary | ICD-10-CM | POA: Diagnosis not present

## 2023-11-14 DIAGNOSIS — Z1159 Encounter for screening for other viral diseases: Secondary | ICD-10-CM | POA: Diagnosis not present

## 2023-11-14 DIAGNOSIS — Z0000 Encounter for general adult medical examination without abnormal findings: Secondary | ICD-10-CM

## 2023-11-14 DIAGNOSIS — R7989 Other specified abnormal findings of blood chemistry: Secondary | ICD-10-CM | POA: Diagnosis not present

## 2023-11-14 DIAGNOSIS — F419 Anxiety disorder, unspecified: Secondary | ICD-10-CM

## 2023-11-14 LAB — URINALYSIS, ROUTINE W REFLEX MICROSCOPIC
Bilirubin, UA: NEGATIVE
Glucose, UA: NEGATIVE
Ketones, UA: NEGATIVE
Leukocytes,UA: NEGATIVE
Nitrite, UA: NEGATIVE
Protein,UA: NEGATIVE
RBC, UA: NEGATIVE
Specific Gravity, UA: 1.025 (ref 1.005–1.030)
Urobilinogen, Ur: 0.2 mg/dL (ref 0.2–1.0)
pH, UA: 6 (ref 5.0–7.5)

## 2023-11-14 MED ORDER — CLONAZEPAM 1 MG PO TABS: 1.0000 mg | ORAL_TABLET | Freq: Every day | ORAL | 2 refills | Status: DC

## 2023-11-14 NOTE — Progress Notes (Signed)
BP 132/85 (BP Location: Left Arm, Patient Position: Sitting, Cuff Size: Large)   Pulse 66   Temp 98 F (36.7 C) (Oral)   Ht 5\' 10"  (1.778 m)   Wt 198 lb 3.2 oz (89.9 kg)   SpO2 99%   BMI 28.44 kg/m    Subjective:    Patient ID: Antonio Frank, male    DOB: December 27, 1970, 52 y.o.   MRN: [id number]  HPI: Antonio Frank is a 52 y.o. male presenting on 11/14/2023 for comprehensive medical examination. Current medical complaints include: Anxiety  He currently lives with: Interim Problems from his last visit: no  ANXIETY/STRESS GAD: 2, PhQ9: 5 Currently taking Klonopin 3-4x weekly which works well for him. Last refill was October. Tried Pristiq based on Gene sight testing which did not work well for him.  Would like to continue with the Klonopin.   Duration:controlled Anxious mood: yes high and low at different times, aggravated with allergies Excessive worrying: yes Irritability: yes  Sweating: no Nausea: no Palpitations: No Hyperventilation: no Panic attacks: Yes but improved Agoraphobia: no  Obscessions/compulsions: no Depressed mood: yes    08/11/2023   10:53 AM 07/07/2023    9:03 AM 04/06/2023   11:30 AM 10/31/2022    9:30 AM 09/29/2022    2:11 PM  Depression screen PHQ 2/9  Decreased Interest 1 1 1 1 1   Down, Depressed, Hopeless 1 2 1 1 1   PHQ - 2 Score 2 3 2 2 2   Altered sleeping 1 1 1 3 1   Tired, decreased energy 0 1 0 1 1  Change in appetite 0 1 0 0 0  Feeling bad or failure about yourself  1 2 0 1 1  Trouble concentrating 0 1 0 0 0  Moving slowly or fidgety/restless 0 0 0 0 0  Suicidal thoughts 0 0 0 1 0  PHQ-9 Score 4 9 3 8 5   Difficult doing work/chores Not difficult at all Not difficult at all Somewhat difficult     Anhedonia: no Weight changes: no Insomnia: no   Hypersomnia: no Fatigue/loss of energy: no Feelings of worthlessness: yes Feelings of guilt: no Impaired concentration/indecisiveness: no Suicidal ideations: no  Crying spells: no Recent  Stressors/Life Changes: yes   Relationship problems: yes   Family stress: yes     Financial stress: yes    Job stress: yes    Recent death/loss: yes   Depression Screen done today and results listed below:     11/14/2023   10:38 AM 08/11/2023   10:53 AM 07/07/2023    9:03 AM 04/06/2023   11:30 AM 10/31/2022    9:30 AM  Depression screen PHQ 2/9  Decreased Interest 0 1 1 1 1   Down, Depressed, Hopeless 0 1 2 1 1   PHQ - 2 Score 0 2 3 2 2   Altered sleeping 1 1 1 1 3   Tired, decreased energy 1 0 1 0 1  Change in appetite 0 0 1 0 0  Feeling bad or failure about yourself  0 1 2 0 1  Trouble concentrating 0 0 1 0 0  Moving slowly or fidgety/restless 0 0 0 0 0  Suicidal thoughts 0 0 0 0 1  PHQ-9 Score 2 4 9 3 8   Difficult doing work/chores  Not difficult at all Not difficult at all Somewhat difficult       11/14/2023   10:38 AM 08/11/2023   10:54 AM 07/07/2023    9:04 AM 04/06/2023   11:30  AM  GAD 7 : Generalized Anxiety Score  Nervous, Anxious, on Edge 1 2 1 3   Control/stop worrying 1 1 2 3   Worry too much - different things 1 1 1 3   Trouble relaxing 1 1 1 3   Restless 1 1 1 3   Easily annoyed or irritable 0 1 1 2   Afraid - awful might happen 0 0 0 1  Total GAD 7 Score 5 7 7 18   Anxiety Difficulty   Not difficult at all Somewhat difficult      The patient does not have a history of falls. I did complete a risk assessment for falls. A plan of care for falls was documented.   Past Medical History:  Past Medical History:  Diagnosis Date   Anxiety    Hypertension    Migraines     Surgical History:  Past Surgical History:  Procedure Laterality Date   Broken nenck     Age 78   FRACTURE SURGERY  2001   Vertebral Fracture with hematoma   TONSILLECTOMY      Medications:  No current outpatient medications on file prior to visit.   No current facility-administered medications on file prior to visit.    Allergies:  No Known Allergies  Social History:  Social History    Socioeconomic History   Marital status: Married    Spouse name: Not on file   Number of children: Not on file   Years of education: Not on file   Highest education level: Not on file  Occupational History   Not on file  Tobacco Use   Smoking status: Former    Types: Cigars   Smokeless tobacco: Former    Types: Chew    Quit date: 02/03/2016  Vaping Use   Vaping status: Never Used  Substance and Sexual Activity   Alcohol use: Not Currently   Drug use: No   Sexual activity: Not on file  Other Topics Concern   Not on file  Social History Narrative   Not on file   Social Determinants of Health   Financial Resource Strain: Not on file  Food Insecurity: Not on file  Transportation Needs: Not on file  Physical Activity: Not on file  Stress: Not on file  Social Connections: Not on file  Intimate Partner Violence: Not on file   Social History   Tobacco Use  Smoking Status Former   Types: Cigars  Smokeless Tobacco Former   Types: Chew   Quit date: 02/03/2016   Social History   Substance and Sexual Activity  Alcohol Use Not Currently    Family History:  Family History  Problem Relation Age of Onset   Hypertension Mother    Cancer Mother    Cancer Father        Throat cancer   Heart attack Sister    Cancer Maternal Grandmother     Past medical history, surgical history, medications, allergies, family history and social history reviewed with patient today and changes made to appropriate areas of the chart.   Review of Systems  Psychiatric/Behavioral:  The patient is nervous/anxious.    All other ROS negative except what is listed above and in the HPI.      Objective:    BP 132/85 (BP Location: Left Arm, Patient Position: Sitting, Cuff Size: Large)   Pulse 66   Temp 98 F (36.7 C) (Oral)   Ht 5\' 10"  (1.778 m)   Wt 198 lb 3.2 oz (89.9 kg)   SpO2  99%   BMI 28.44 kg/m   Wt Readings from Last 3 Encounters:  11/14/23 198 lb 3.2 oz (89.9 kg)  08/11/23 193  lb (87.5 kg)  07/07/23 190 lb 12.8 oz (86.5 kg)    Physical Exam Vitals and nursing note reviewed.  Constitutional:      General: He is not in acute distress.    Appearance: Normal appearance. He is not ill-appearing, toxic-appearing or diaphoretic.  HENT:     Head: Normocephalic.     Right Ear: Tympanic membrane, ear canal and external ear normal.     Left Ear: Tympanic membrane, ear canal and external ear normal.     Nose: Nose normal. No congestion or rhinorrhea.     Mouth/Throat:     Mouth: Mucous membranes are moist.  Eyes:     General:        Right eye: No discharge.        Left eye: No discharge.     Extraocular Movements: Extraocular movements intact.     Conjunctiva/sclera: Conjunctivae normal.     Pupils: Pupils are equal, round, and reactive to light.  Cardiovascular:     Rate and Rhythm: Normal rate and regular rhythm.     Heart sounds: No murmur heard. Pulmonary:     Effort: Pulmonary effort is normal. No respiratory distress.     Breath sounds: Normal breath sounds. No wheezing, rhonchi or rales.  Abdominal:     General: Abdomen is flat. Bowel sounds are normal. There is no distension.     Palpations: Abdomen is soft.     Tenderness: There is no abdominal tenderness. There is no guarding.  Musculoskeletal:     Cervical back: Normal range of motion and neck supple.  Skin:    General: Skin is warm and dry.     Capillary Refill: Capillary refill takes less than 2 seconds.  Neurological:     General: No focal deficit present.     Mental Status: He is alert and oriented to person, place, and time.     Cranial Nerves: No cranial nerve deficit.     Motor: No weakness.     Deep Tendon Reflexes: Reflexes normal.  Psychiatric:        Mood and Affect: Mood normal.        Behavior: Behavior normal.        Thought Content: Thought content normal.        Judgment: Judgment normal.     Results for orders placed or performed in visit on 04/06/23  [id number]  11+Oxyco+Alc+Crt-Bund  Result Value Ref Range   Ethanol Negative Cutoff=0.020 %   Amphetamines, Urine Negative Cutoff=1000 ng/mL   Barbiturate Negative Cutoff=200 ng/mL   BENZODIAZ UR QL Negative Cutoff=200 ng/mL   Cannabinoid Quant, Ur Negative Cutoff=50 ng/mL   Cocaine (Metabolite) Negative Cutoff=300 ng/mL   OPIATE SCREEN URINE Negative Cutoff=300 ng/mL   Oxycodone/Oxymorphone, Urine Negative Cutoff=300 ng/mL   Phencyclidine Negative Cutoff=25 ng/mL   Methadone Screen, Urine Negative Cutoff=300 ng/mL   Propoxyphene Negative Cutoff=300 ng/mL   Meperidine Negative Cutoff=200 ng/mL   Tramadol Negative Cutoff=200 ng/mL   Creatinine 60.7 20.0 - 300.0 mg/dL   pH, Urine 5.9 4.5 - 8.9      Assessment & Plan:   Problem List Items Addressed This Visit       Cardiovascular and Mediastinum   Hypertension    Chronic.  Controlled without medication. Labs ordered today.  Return to clinic in 6 months for reevaluation.  Call sooner  if concerns arise.          Other   Anxiety with agitation    Chronic.  Controlled with Klonopin.  UDS and controlled substance agreement up to date.  Patient is aware that he will need to follow up every 3 months for refills.  PDMP checked.  Call sooner if concerns arise.       Low serum vitamin D    Labs ordered at visit today.  Will make recommendations based on lab results.        Relevant Orders   Vitamin D (25 hydroxy)   Other Visit Diagnoses     Annual physical exam    -  Primary   Health maintenance reviewed during visit today. Labs ordered.  Vaccines discussed.  Discussed colon cancer screening.   Relevant Orders   TSH   PSA   Lipid panel   CBC with Differential/Platelet   Comprehensive metabolic panel   Urinalysis, Routine w reflex microscopic   Screening for ischemic heart disease       Relevant Orders   Lipid panel   Encounter for hepatitis C screening test for low risk patient       Relevant Orders   Hepatitis C Antibody         Discussed aspirin prophylaxis for myocardial infarction prevention and decision was it was not indicated  LABORATORY TESTING:  Health maintenance labs ordered today as discussed above.   The natural history of prostate cancer and ongoing controversy regarding screening and potential treatment outcomes of prostate cancer has been discussed with the patient. The meaning of a false positive PSA and a false negative PSA has been discussed. He indicates understanding of the limitations of this screening test and wishes to proceed with screening PSA testing.   IMMUNIZATIONS:   - Tdap: Tetanus vaccination status reviewed: last tetanus booster within 10 years. - Influenza: Refused - Pneumovax: Not applicable - Prevnar: Not applicable - COVID: Refused - HPV: Not applicable - Shingrix vaccine: Refused  SCREENING: - Colonoscopy:  Needs to think about it- does have family history of colon cancer    Discussed with patient purpose of the colonoscopy is to detect colon cancer at curable precancerous or early stages   - AAA Screening: Not applicable  -Hearing Test: Not applicable  -Spirometry: Not applicable   PATIENT COUNSELING:    Sexuality: Discussed sexually transmitted diseases, partner selection, use of condoms, avoidance of unintended pregnancy  and contraceptive alternatives.   Advised to avoid cigarette smoking.  I discussed with the patient that most people either abstain from alcohol or drink within safe limits (<=14/week and <=4 drinks/occasion for males, <=7/weeks and <= 3 drinks/occasion for females) and that the risk for alcohol disorders and other health effects rises proportionally with the number of drinks per week and how often a drinker exceeds daily limits.  Discussed cessation/primary prevention of drug use and availability of treatment for abuse.   Diet: Encouraged to adjust caloric intake to maintain  or achieve ideal body weight, to reduce intake of dietary saturated  fat and total fat, to limit sodium intake by avoiding high sodium foods and not adding table salt, and to maintain adequate dietary potassium and calcium preferably from fresh fruits, vegetables, and low-fat dairy products.    stressed the importance of regular exercise  Injury prevention: Discussed safety belts, safety helmets, smoke detector, smoking near bedding or upholstery.   Dental health: Discussed importance of regular tooth brushing, flossing, and dental visits.  Follow up plan: NEXT PREVENTATIVE PHYSICAL DUE IN 1 YEAR. Return in about 3 months (around 02/12/2024) for Depression/Anxiety FU.

## 2023-11-14 NOTE — Assessment & Plan Note (Signed)
Labs ordered at visit today.  Will make recommendations based on lab results.   

## 2023-11-14 NOTE — Assessment & Plan Note (Signed)
Chronic.  Controlled with Klonopin.  UDS and controlled substance agreement up to date.  Patient is aware that he will need to follow up every 3 months for refills.  PDMP checked.  Call sooner if concerns arise.

## 2023-11-14 NOTE — Assessment & Plan Note (Signed)
Chronic.  Controlled without medication..  Labs ordered today.  Return to clinic in 6 months for reevaluation.  Call sooner if concerns arise.  ° °

## 2023-11-15 LAB — LIPID PANEL
Chol/HDL Ratio: 3 {ratio} (ref 0.0–5.0)
Cholesterol, Total: 199 mg/dL (ref 100–199)
HDL: 66 mg/dL (ref >39)
LDL Chol Calc (NIH): 121 mg/dL — ABNORMAL HIGH (ref 0–99)
Triglycerides: 66 mg/dL (ref 0–149)
VLDL Cholesterol Cal: 12 mg/dL (ref 5–40)

## 2023-11-15 LAB — COMPREHENSIVE METABOLIC PANEL
ALT: 21 [IU]/L (ref 0–44)
AST: 18 [IU]/L (ref 0–40)
Albumin: 4.7 g/dL (ref 3.8–4.9)
Alkaline Phosphatase: 75 [IU]/L (ref 44–121)
BUN/Creatinine Ratio: 22 — ABNORMAL HIGH (ref 9–20)
BUN: 25 mg/dL — ABNORMAL HIGH (ref 6–24)
Bilirubin Total: 0.8 mg/dL (ref 0.0–1.2)
CO2: 23 mmol/L (ref 20–29)
Calcium: 9.9 mg/dL (ref 8.7–10.2)
Chloride: 102 mmol/L (ref 96–106)
Creatinine, Ser: 1.13 mg/dL (ref 0.76–1.27)
Globulin, Total: 2.2 g/dL (ref 1.5–4.5)
Glucose: 96 mg/dL (ref 70–99)
Potassium: 4.7 mmol/L (ref 3.5–5.2)
Sodium: 139 mmol/L (ref 134–144)
Total Protein: 6.9 g/dL (ref 6.0–8.5)
eGFR: 78 mL/min/{1.73_m2} (ref >59)

## 2023-11-15 LAB — CBC WITH DIFFERENTIAL/PLATELET
Basophils Absolute: 0.1 10*3/uL (ref 0.0–0.2)
Basos: 1 %
EOS (ABSOLUTE): 0.2 10*3/uL (ref 0.0–0.4)
Eos: 2 %
Hematocrit: 47.8 % (ref 37.5–51.0)
Hemoglobin: 15.7 g/dL (ref 13.0–17.7)
Immature Grans (Abs): 0 10*3/uL (ref 0.0–0.1)
Immature Granulocytes: 0 %
Lymphocytes Absolute: 1.9 10*3/uL (ref 0.7–3.1)
Lymphs: 22 %
MCH: 30.7 pg (ref 26.6–33.0)
MCHC: 32.8 g/dL (ref 31.5–35.7)
MCV: 93 fL (ref 79–97)
Monocytes Absolute: 0.5 10*3/uL (ref 0.1–0.9)
Monocytes: 6 %
Neutrophils Absolute: 5.8 10*3/uL (ref 1.4–7.0)
Neutrophils: 69 %
Platelets: 349 10*3/uL (ref 150–450)
RBC: 5.12 x10E6/uL (ref 4.14–5.80)
RDW: 12.4 % (ref 11.6–15.4)
WBC: 8.5 10*3/uL (ref 3.4–10.8)

## 2023-11-15 LAB — PSA: Prostate Specific Ag, Serum: 0.5 ng/mL (ref 0.0–4.0)

## 2023-11-15 LAB — TSH: TSH: 0.928 u[IU]/mL (ref 0.450–4.500)

## 2023-11-15 LAB — HEPATITIS C ANTIBODY: Hep C Virus Ab: NONREACTIVE

## 2023-11-15 LAB — VITAMIN D 25 HYDROXY (VIT D DEFICIENCY, FRACTURES): Vit D, 25-Hydroxy: 23.1 ng/mL — ABNORMAL LOW (ref 30.0–100.0)

## 2023-11-22 ENCOUNTER — Telehealth: Payer: Self-pay | Admitting: *Deleted

## 2023-11-22 NOTE — Telephone Encounter (Signed)
  Chief Complaint: Results Symptoms: NA Frequency: Na Pertinent Negatives: Patient denies NA Disposition: [] ED /[] Urgent Care (no appt availability in office) / [] Appointment(In office/virtual)/ []  [facility]/ [] Home Care/ [] Refused Recommended Disposition /[] [facility]/ []  Follow-up with PCP Additional Notes:  Result note read to pt, verbalizes understanding.     Hi Antonio Frank. It was nice to see you yesterday.  Your lab work looks good.  Your cholesterol is slightly elevated. I recommend a low fat diet and exercise.  Your Vitamin D is low.  I recommend 2000 international units  over the counter daily.  Otherwise, no concerns at this time. Continue with your current medication regimen.  Follow up as discussed.  Please let me know if you have any questions.

## 2024-02-12 ENCOUNTER — Ambulatory Visit: Payer: Self-pay | Admitting: Nurse Practitioner

## 2024-02-12 NOTE — Progress Notes (Deleted)
 There were no vitals taken for this visit.   Subjective:    Patient ID: Antonio Frank, male    DOB: 03-Feb-1971, 53 y.o.   MRN: [id number]  HPI: Antonio Frank is a 53 y.o. male  No chief complaint on file.  ANXIETY/STRESS GAD: 2, PhQ9: 5 Currently taking Klonopin 3-4x weekly which works well for him. Last refill was October. Tried Pristiq based on Gene sight testing which did not work well for him.  Would like to continue with the Klonopin.   Duration:controlled Anxious mood: yes high and low at different times, aggravated with allergies Excessive worrying: yes Irritability: yes  Sweating: no Nausea: no Palpitations: No Hyperventilation: no Panic attacks: Yes but improved Agoraphobia: no  Obscessions/compulsions: no Depressed mood: yes    08/11/2023   10:53 AM 07/07/2023    9:03 AM 04/06/2023   11:30 AM 10/31/2022    9:30 AM 09/29/2022    2:11 PM  Depression screen PHQ 2/9  Decreased Interest 1 1 1 1 1   Down, Depressed, Hopeless 1 2 1 1 1   PHQ - 2 Score 2 3 2 2 2   Altered sleeping 1 1 1 3 1   Tired, decreased energy 0 1 0 1 1  Change in appetite 0 1 0 0 0  Feeling bad or failure about yourself  1 2 0 1 1  Trouble concentrating 0 1 0 0 0  Moving slowly or fidgety/restless 0 0 0 0 0  Suicidal thoughts 0 0 0 1 0  PHQ-9 Score 4 9 3 8 5   Difficult doing work/chores Not difficult at all Not difficult at all Somewhat difficult     Anhedonia: no Weight changes: no Insomnia: no   Hypersomnia: no Fatigue/loss of energy: no Feelings of worthlessness: yes Feelings of guilt: no Impaired concentration/indecisiveness: no Suicidal ideations: no  Crying spells: no Recent Stressors/Life Changes: yes   Relationship problems: yes   Family stress: yes     Financial stress: yes    Job stress: yes    Recent death/loss: yes    Relevant past medical, surgical, family and social history reviewed and updated as indicated. Interim medical history since our last visit  reviewed. Allergies and medications reviewed and updated.  Review of Systems  Per HPI unless specifically indicated above     Objective:    There were no vitals taken for this visit.  Wt Readings from Last 3 Encounters:  11/14/23 198 lb 3.2 oz (89.9 kg)  08/11/23 193 lb (87.5 kg)  07/07/23 190 lb 12.8 oz (86.5 kg)    Physical Exam  Results for orders placed or performed in visit on 11/14/23  Urinalysis, Routine w reflex microscopic   Collection Time: 11/14/23 10:52 AM  Result Value Ref Range   Specific Gravity, UA 1.025 1.005 - 1.030   pH, UA 6.0 5.0 - 7.5   Color, UA Yellow Yellow   Appearance Ur Clear Clear   Leukocytes,UA Negative Negative   Protein,UA Negative Negative/Trace   Glucose, UA Negative Negative   Ketones, UA Negative Negative   RBC, UA Negative Negative   Bilirubin, UA Negative Negative   Urobilinogen, Ur 0.2 0.2 - 1.0 mg/dL   Nitrite, UA Negative Negative   Microscopic Examination Comment   TSH   Collection Time: 11/14/23 11:07 AM  Result Value Ref Range   TSH 0.928 0.450 - 4.500 uIU/mL  PSA   Collection Time: 11/14/23 11:07 AM  Result Value Ref Range   Prostate Specific Ag, Serum 0.5  0.0 - 4.0 ng/mL  Lipid panel   Collection Time: 11/14/23 11:07 AM  Result Value Ref Range   Cholesterol, Total 199 100 - 199 mg/dL   Triglycerides 66 0 - 149 mg/dL   HDL 66 >40 mg/dL   VLDL Cholesterol Cal 12 5 - 40 mg/dL   LDL Chol Calc (NIH) 981 (H) 0 - 99 mg/dL   Chol/HDL Ratio 3.0 0.0 - 5.0 ratio  CBC with Differential/Platelet   Collection Time: 11/14/23 11:07 AM  Result Value Ref Range   WBC 8.5 3.4 - 10.8 x10E3/uL   RBC 5.12 4.14 - 5.80 x10E6/uL   Hemoglobin 15.7 13.0 - 17.7 g/dL   Hematocrit 19.1 47.8 - 51.0 %   MCV 93 79 - 97 fL   MCH 30.7 26.6 - 33.0 pg   MCHC 32.8 31.5 - 35.7 g/dL   RDW 29.5 62.1 - 30.8 %   Platelets 349 150 - 450 x10E3/uL   Neutrophils 69 Not Estab. %   Lymphs 22 Not Estab. %   Monocytes 6 Not Estab. %   Eos 2 Not Estab. %    Basos 1 Not Estab. %   Neutrophils Absolute 5.8 1.4 - 7.0 x10E3/uL   Lymphocytes Absolute 1.9 0.7 - 3.1 x10E3/uL   Monocytes Absolute 0.5 0.1 - 0.9 x10E3/uL   EOS (ABSOLUTE) 0.2 0.0 - 0.4 x10E3/uL   Basophils Absolute 0.1 0.0 - 0.2 x10E3/uL   Immature Granulocytes 0 Not Estab. %   Immature Grans (Abs) 0.0 0.0 - 0.1 x10E3/uL  Comprehensive metabolic panel   Collection Time: 11/14/23 11:07 AM  Result Value Ref Range   Glucose 96 70 - 99 mg/dL   BUN 25 (H) 6 - 24 mg/dL   Creatinine, Ser 6.57 0.76 - 1.27 mg/dL   eGFR 78 >84 ON/GEX/5.28   BUN/Creatinine Ratio 22 (H) 9 - 20   Sodium 139 134 - 144 mmol/L   Potassium 4.7 3.5 - 5.2 mmol/L   Chloride 102 96 - 106 mmol/L   CO2 23 20 - 29 mmol/L   Calcium 9.9 8.7 - 10.2 mg/dL   Total Protein 6.9 6.0 - 8.5 g/dL   Albumin 4.7 3.8 - 4.9 g/dL   Globulin, Total 2.2 1.5 - 4.5 g/dL   Bilirubin Total 0.8 0.0 - 1.2 mg/dL   Alkaline Phosphatase 75 44 - 121 IU/L   AST 18 0 - 40 IU/L   ALT 21 0 - 44 IU/L  Vitamin D (25 hydroxy)   Collection Time: 11/14/23 11:07 AM  Result Value Ref Range   Vit D, 25-Hydroxy 23.1 (L) 30.0 - 100.0 ng/mL  Hepatitis C Antibody   Collection Time: 11/14/23 11:07 AM  Result Value Ref Range   Hep C Virus Ab Non Reactive Non Reactive      Assessment & Plan:   Problem List Items Addressed This Visit   None    Follow up plan: No follow-ups on file.

## 2024-03-20 ENCOUNTER — Encounter: Payer: Self-pay | Admitting: Nurse Practitioner

## 2024-03-20 ENCOUNTER — Ambulatory Visit: Admitting: Nurse Practitioner

## 2024-03-20 VITALS — BP 108/70 | HR 61 | Temp 98.7°F | Resp 16 | Ht 70.0 in | Wt 195.2 lb

## 2024-03-20 DIAGNOSIS — F419 Anxiety disorder, unspecified: Secondary | ICD-10-CM

## 2024-03-20 DIAGNOSIS — R451 Restlessness and agitation: Secondary | ICD-10-CM

## 2024-03-20 MED ORDER — CLONAZEPAM 1 MG PO TABS: 1.0000 mg | ORAL_TABLET | Freq: Every day | ORAL | 2 refills | Status: DC

## 2024-03-20 NOTE — Assessment & Plan Note (Signed)
 Chronic.  Controlled with Klonopin.  UDS and controlled substance agreement up to date.  Last refill was February.  Patient is aware that he will need to follow up every 3 months for refills.  PDMP checked.  Call sooner if concerns arise.

## 2024-03-20 NOTE — Progress Notes (Signed)
 BP 108/70 (BP Location: Left Arm, Patient Position: Sitting, Cuff Size: Large)   Pulse 61   Temp 98.7 F (37.1 C) (Oral)   Resp 16   Ht 5\' 10"  (1.778 m)   Wt 195 lb 3.2 oz (88.5 kg)   SpO2 98%   BMI 28.01 kg/m    Subjective:    Patient ID: Antonio Frank, male    DOB: 07/11/1971, 54 y.o.   MRN: [id number]  HPI: Antonio Frank is a 53 y.o. male  Chief Complaint  Patient presents with   Anxiety    Refill. Pretty much the same. Work load makes things tough for him. Doesn't take as often as before though he does say he has plenty of reasons for his anxiety.   ANXIETY/STRESS GAD: 2, PhQ9: 5 Currently taking Klonopin 2-4x weekly which works well for him. Last refill was February. Tried Pristiq based on Gene sight testing which did not work well for him.  Would like to continue with the Klonopin.  He only takes them when he needs them.   Duration:controlled Anxious mood: yes high and low at different times, aggravated with allergies Excessive worrying: yes Irritability: yes  Sweating: no Nausea: no Palpitations: No Hyperventilation: no Panic attacks: Yes but improved Agoraphobia: no  Obscessions/compulsions: no Depressed mood: yes   Relevant past medical, surgical, family and social history reviewed and updated as indicated. Interim medical history since our last visit reviewed. Allergies and medications reviewed and updated.  Review of Systems  Psychiatric/Behavioral:  The patient is nervous/anxious.     Per HPI unless specifically indicated above     Objective:    BP 108/70 (BP Location: Left Arm, Patient Position: Sitting, Cuff Size: Large)   Pulse 61   Temp 98.7 F (37.1 C) (Oral)   Resp 16   Ht 5\' 10"  (1.778 m)   Wt 195 lb 3.2 oz (88.5 kg)   SpO2 98%   BMI 28.01 kg/m   Wt Readings from Last 3 Encounters:  03/20/24 195 lb 3.2 oz (88.5 kg)  11/14/23 198 lb 3.2 oz (89.9 kg)  08/11/23 193 lb (87.5 kg)    Physical Exam Vitals and nursing note reviewed.   Constitutional:      General: He is not in acute distress.    Appearance: Normal appearance. He is not ill-appearing, toxic-appearing or diaphoretic.  HENT:     Head: Normocephalic.     Right Ear: External ear normal.     Left Ear: External ear normal.     Nose: Nose normal. No congestion or rhinorrhea.     Mouth/Throat:     Mouth: Mucous membranes are moist.  Eyes:     General:        Right eye: No discharge.        Left eye: No discharge.     Extraocular Movements: Extraocular movements intact.     Conjunctiva/sclera: Conjunctivae normal.     Pupils: Pupils are equal, round, and reactive to light.  Cardiovascular:     Rate and Rhythm: Normal rate and regular rhythm.     Heart sounds: No murmur heard. Pulmonary:     Effort: Pulmonary effort is normal. No respiratory distress.     Breath sounds: Normal breath sounds. No wheezing, rhonchi or rales.  Abdominal:     General: Abdomen is flat. Bowel sounds are normal.  Musculoskeletal:     Cervical back: Normal range of motion and neck supple.  Skin:    General: Skin is  warm and dry.     Capillary Refill: Capillary refill takes less than 2 seconds.  Neurological:     General: No focal deficit present.     Mental Status: He is alert and oriented to person, place, and time.  Psychiatric:        Mood and Affect: Mood normal.        Behavior: Behavior normal.        Thought Content: Thought content normal.        Judgment: Judgment normal.     Results for orders placed or performed in visit on 11/14/23  Urinalysis, Routine w reflex microscopic   Collection Time: 11/14/23 10:52 AM  Result Value Ref Range   Specific Gravity, UA 1.025 1.005 - 1.030   pH, UA 6.0 5.0 - 7.5   Color, UA Yellow Yellow   Appearance Ur Clear Clear   Leukocytes,UA Negative Negative   Protein,UA Negative Negative/Trace   Glucose, UA Negative Negative   Ketones, UA Negative Negative   RBC, UA Negative Negative   Bilirubin, UA Negative Negative    Urobilinogen, Ur 0.2 0.2 - 1.0 mg/dL   Nitrite, UA Negative Negative   Microscopic Examination Comment   TSH   Collection Time: 11/14/23 11:07 AM  Result Value Ref Range   TSH 0.928 0.450 - 4.500 uIU/mL  PSA   Collection Time: 11/14/23 11:07 AM  Result Value Ref Range   Prostate Specific Ag, Serum 0.5 0.0 - 4.0 ng/mL  Lipid panel   Collection Time: 11/14/23 11:07 AM  Result Value Ref Range   Cholesterol, Total 199 100 - 199 mg/dL   Triglycerides 66 0 - 149 mg/dL   HDL 66 >64 mg/dL   VLDL Cholesterol Cal 12 5 - 40 mg/dL   LDL Chol Calc (NIH) 332 (H) 0 - 99 mg/dL   Chol/HDL Ratio 3.0 0.0 - 5.0 ratio  CBC with Differential/Platelet   Collection Time: 11/14/23 11:07 AM  Result Value Ref Range   WBC 8.5 3.4 - 10.8 x10E3/uL   RBC 5.12 4.14 - 5.80 x10E6/uL   Hemoglobin 15.7 13.0 - 17.7 g/dL   Hematocrit 95.1 88.4 - 51.0 %   MCV 93 79 - 97 fL   MCH 30.7 26.6 - 33.0 pg   MCHC 32.8 31.5 - 35.7 g/dL   RDW 16.6 06.3 - 01.6 %   Platelets 349 150 - 450 x10E3/uL   Neutrophils 69 Not Estab. %   Lymphs 22 Not Estab. %   Monocytes 6 Not Estab. %   Eos 2 Not Estab. %   Basos 1 Not Estab. %   Neutrophils Absolute 5.8 1.4 - 7.0 x10E3/uL   Lymphocytes Absolute 1.9 0.7 - 3.1 x10E3/uL   Monocytes Absolute 0.5 0.1 - 0.9 x10E3/uL   EOS (ABSOLUTE) 0.2 0.0 - 0.4 x10E3/uL   Basophils Absolute 0.1 0.0 - 0.2 x10E3/uL   Immature Granulocytes 0 Not Estab. %   Immature Grans (Abs) 0.0 0.0 - 0.1 x10E3/uL  Comprehensive metabolic panel   Collection Time: 11/14/23 11:07 AM  Result Value Ref Range   Glucose 96 70 - 99 mg/dL   BUN 25 (H) 6 - 24 mg/dL   Creatinine, Ser 0.10 0.76 - 1.27 mg/dL   eGFR 78 >93 AT/FTD/3.22   BUN/Creatinine Ratio 22 (H) 9 - 20   Sodium 139 134 - 144 mmol/L   Potassium 4.7 3.5 - 5.2 mmol/L   Chloride 102 96 - 106 mmol/L   CO2 23 20 - 29 mmol/L   Calcium  9.9 8.7 - 10.2 mg/dL   Total Protein 6.9 6.0 - 8.5 g/dL   Albumin 4.7 3.8 - 4.9 g/dL   Globulin, Total 2.2 1.5 - 4.5  g/dL   Bilirubin Total 0.8 0.0 - 1.2 mg/dL   Alkaline Phosphatase 75 44 - 121 IU/L   AST 18 0 - 40 IU/L   ALT 21 0 - 44 IU/L  Vitamin D (25 hydroxy)   Collection Time: 11/14/23 11:07 AM  Result Value Ref Range   Vit D, 25-Hydroxy 23.1 (L) 30.0 - 100.0 ng/mL  Hepatitis C Antibody   Collection Time: 11/14/23 11:07 AM  Result Value Ref Range   Hep C Virus Ab Non Reactive Non Reactive      Assessment & Plan:   Problem List Items Addressed This Visit       Other   Anxiety with agitation - Primary   Chronic.  Controlled with Klonopin.  UDS and controlled substance agreement up to date.  Last refill was February.  Patient is aware that he will need to follow up every 3 months for refills.  PDMP checked.  Call sooner if concerns arise.         Follow up plan: Return in about 3 months (around 06/19/2024) for Medication Management.

## 2024-06-21 ENCOUNTER — Encounter: Payer: Self-pay | Admitting: Nurse Practitioner

## 2024-06-21 ENCOUNTER — Ambulatory Visit (INDEPENDENT_AMBULATORY_CARE_PROVIDER_SITE_OTHER): Payer: Self-pay | Admitting: Nurse Practitioner

## 2024-06-21 VITALS — BP 90/55 | HR 59 | Temp 98.5°F | Resp 15 | Ht 70.0 in | Wt 195.0 lb

## 2024-06-21 DIAGNOSIS — S00412D Abrasion of left ear, subsequent encounter: Secondary | ICD-10-CM

## 2024-06-21 DIAGNOSIS — F419 Anxiety disorder, unspecified: Secondary | ICD-10-CM

## 2024-06-21 DIAGNOSIS — I10 Essential (primary) hypertension: Secondary | ICD-10-CM

## 2024-06-21 DIAGNOSIS — R451 Restlessness and agitation: Secondary | ICD-10-CM

## 2024-06-21 MED ORDER — CLONAZEPAM 1 MG PO TABS: 1.0000 mg | ORAL_TABLET | Freq: Every day | ORAL | 2 refills | Status: DC

## 2024-06-21 NOTE — Progress Notes (Signed)
 BP (!) 90/55 (BP Location: Left Arm, Patient Position: Sitting, Cuff Size: Large)   Pulse (!) 59   Temp 98.5 F (36.9 C) (Oral)   Resp 15   Ht 5' 10 (1.778 m)   Wt 195 lb (88.5 kg)   SpO2 97%   BMI 27.98 kg/m    Subjective:    Patient ID: Antonio Frank, male    DOB: [birth year], 53 y.o.   MRN: [id number]  HPI: Antonio Frank is a 53 y.o. male  Chief Complaint  Patient presents with   Medication Management    Doing well on it.    Ear Pain    Spot on his left ear that scabs over, has not been causing pain.    Fatigue    Low carb and very physical. Says he doesn't have much energy.     ANXIETY/STRESS GAD: 2, PhQ9: 5 Currently taking Klonopin  2-4x weekly which works well for him. Last refill was February. Tried Pristiq  based on Gene sight testing which did not work well for him.  Would like to continue with the Klonopin .  He only takes them when he needs them.   Duration:controlled Anxious mood: yes high and low at different times, aggravated with allergies Excessive worrying: yes Irritability: yes  Sweating: no Nausea: no Palpitations: No Hyperventilation: no Panic attacks: Yes but improved Agoraphobia: no  Obscessions/compulsions: no Depressed mood: yes  Patient has a spot on his ear.  He states it has been there for years.  Scabs over but never goes away.     Relevant past medical, surgical, family and social history reviewed and updated as indicated. Interim medical history since our last visit reviewed. Allergies and medications reviewed and updated.  Review of Systems  Psychiatric/Behavioral:  The patient is nervous/anxious.     Per HPI unless specifically indicated above     Objective:    BP (!) 90/55 (BP Location: Left Arm, Patient Position: Sitting, Cuff Size: Large)   Pulse (!) 59   Temp 98.5 F (36.9 C) (Oral)   Resp 15   Ht 5' 10 (1.778 m)   Wt 195 lb (88.5 kg)   SpO2 97%   BMI 27.98 kg/m   Wt Readings from Last 3 Encounters:  06/21/24  195 lb (88.5 kg)  03/20/24 195 lb 3.2 oz (88.5 kg)  11/14/23 198 lb 3.2 oz (89.9 kg)    Physical Exam Vitals and nursing note reviewed.  Constitutional:      General: He is not in acute distress.    Appearance: Normal appearance. He is not ill-appearing, toxic-appearing or diaphoretic.  HENT:     Head: Normocephalic.      Right Ear: External ear normal.     Left Ear: External ear normal.     Nose: Nose normal. No congestion or rhinorrhea.     Mouth/Throat:     Mouth: Mucous membranes are moist.  Eyes:     General:        Right eye: No discharge.        Left eye: No discharge.     Extraocular Movements: Extraocular movements intact.     Conjunctiva/sclera: Conjunctivae normal.     Pupils: Pupils are equal, round, and reactive to light.  Cardiovascular:     Rate and Rhythm: Normal rate and regular rhythm.     Heart sounds: No murmur heard. Pulmonary:     Effort: Pulmonary effort is normal. No respiratory distress.     Breath sounds: Normal breath  sounds. No wheezing, rhonchi or rales.  Abdominal:     General: Abdomen is flat. Bowel sounds are normal.  Musculoskeletal:     Cervical back: Normal range of motion and neck supple.  Skin:    General: Skin is warm and dry.     Capillary Refill: Capillary refill takes less than 2 seconds.  Neurological:     General: No focal deficit present.     Mental Status: He is alert and oriented to person, place, and time.  Psychiatric:        Mood and Affect: Mood normal.        Behavior: Behavior normal.        Thought Content: Thought content normal.        Judgment: Judgment normal.     Results for orders placed or performed in visit on 11/14/23  Urinalysis, Routine w reflex microscopic   Collection Time: 11/14/23 10:52 AM  Result Value Ref Range   Specific Gravity, UA 1.025 1.005 - 1.030   pH, UA 6.0 5.0 - 7.5   Color, UA Yellow Yellow   Appearance Ur Clear Clear   Leukocytes,UA Negative Negative   Protein,UA Negative  Negative/Trace   Glucose, UA Negative Negative   Ketones, UA Negative Negative   RBC, UA Negative Negative   Bilirubin, UA Negative Negative   Urobilinogen, Ur 0.2 0.2 - 1.0 mg/dL   Nitrite, UA Negative Negative   Microscopic Examination Comment   TSH   Collection Time: 11/14/23 11:07 AM  Result Value Ref Range   TSH 0.928 0.450 - 4.500 uIU/mL  PSA   Collection Time: 11/14/23 11:07 AM  Result Value Ref Range   Prostate Specific Ag, Serum 0.5 0.0 - 4.0 ng/mL  Lipid panel   Collection Time: 11/14/23 11:07 AM  Result Value Ref Range   Cholesterol, Total 199 100 - 199 mg/dL   Triglycerides 66 0 - 149 mg/dL   HDL 66 >60 mg/dL   VLDL Cholesterol Cal 12 5 - 40 mg/dL   LDL Chol Calc (NIH) 878 (H) 0 - 99 mg/dL   Chol/HDL Ratio 3.0 0.0 - 5.0 ratio  CBC with Differential/Platelet   Collection Time: 11/14/23 11:07 AM  Result Value Ref Range   WBC 8.5 3.4 - 10.8 x10E3/uL   RBC 5.12 4.14 - 5.80 x10E6/uL   Hemoglobin 15.7 13.0 - 17.7 g/dL   Hematocrit 52.1 62.4 - 51.0 %   MCV 93 79 - 97 fL   MCH 30.7 26.6 - 33.0 pg   MCHC 32.8 31.5 - 35.7 g/dL   RDW 87.5 88.3 - 84.5 %   Platelets 349 150 - 450 x10E3/uL   Neutrophils 69 Not Estab. %   Lymphs 22 Not Estab. %   Monocytes 6 Not Estab. %   Eos 2 Not Estab. %   Basos 1 Not Estab. %   Neutrophils Absolute 5.8 1.4 - 7.0 x10E3/uL   Lymphocytes Absolute 1.9 0.7 - 3.1 x10E3/uL   Monocytes Absolute 0.5 0.1 - 0.9 x10E3/uL   EOS (ABSOLUTE) 0.2 0.0 - 0.4 x10E3/uL   Basophils Absolute 0.1 0.0 - 0.2 x10E3/uL   Immature Granulocytes 0 Not Estab. %   Immature Grans (Abs) 0.0 0.0 - 0.1 x10E3/uL  Comprehensive metabolic panel   Collection Time: 11/14/23 11:07 AM  Result Value Ref Range   Glucose 96 70 - 99 mg/dL   BUN 25 (H) 6 - 24 mg/dL   Creatinine, Ser 8.86 0.76 - 1.27 mg/dL   eGFR 78 >40 fO/fpw/8.26  BUN/Creatinine Ratio 22 (H) 9 - 20   Sodium 139 134 - 144 mmol/L   Potassium 4.7 3.5 - 5.2 mmol/L   Chloride 102 96 - 106 mmol/L   CO2 23  20 - 29 mmol/L   Calcium 9.9 8.7 - 10.2 mg/dL   Total Protein 6.9 6.0 - 8.5 g/dL   Albumin 4.7 3.8 - 4.9 g/dL   Globulin, Total 2.2 1.5 - 4.5 g/dL   Bilirubin Total 0.8 0.0 - 1.2 mg/dL   Alkaline Phosphatase 75 44 - 121 IU/L   AST 18 0 - 40 IU/L   ALT 21 0 - 44 IU/L  Vitamin D  (25 hydroxy)   Collection Time: 11/14/23 11:07 AM  Result Value Ref Range   Vit D, 25-Hydroxy 23.1 (L) 30.0 - 100.0 ng/mL  Hepatitis C Antibody   Collection Time: 11/14/23 11:07 AM  Result Value Ref Range   Hep C Virus Ab Non Reactive Non Reactive      Assessment & Plan:   Problem List Items Addressed This Visit       Cardiovascular and Mediastinum   Hypertension - Primary   Chronic.  Controlled without medication. Return to clinic in 6 months for reevaluation.  Call sooner if concerns arise.         Other   Anxiety with agitation   Chronic.  Controlled with Klonopin .  UDS and controlled substance agreement up to date.  Last refill was February.  Patient is aware that he will need to follow up every 3 months for refills.  PDMP checked.  Call sooner if concerns arise.       Other Visit Diagnoses       Abrasion of left ear, subsequent encounter       Will hold off on dermatology referral due to not having insurance.  He plans to get it back and then the referral will be placed.        Follow up plan: Return in about 3 months (around 09/21/2024) for Medication Management.

## 2024-06-21 NOTE — Assessment & Plan Note (Signed)
Chronic.  Controlled without medication.  Return to clinic in 6 months for reevaluation.  Call sooner if concerns arise.   

## 2024-06-21 NOTE — Assessment & Plan Note (Signed)
 Chronic.  Controlled with Klonopin.  UDS and controlled substance agreement up to date.  Last refill was February.  Patient is aware that he will need to follow up every 3 months for refills.  PDMP checked.  Call sooner if concerns arise.

## 2024-09-24 ENCOUNTER — Ambulatory Visit (INDEPENDENT_AMBULATORY_CARE_PROVIDER_SITE_OTHER): Payer: Self-pay | Admitting: Nurse Practitioner

## 2024-09-24 ENCOUNTER — Encounter: Payer: Self-pay | Admitting: Nurse Practitioner

## 2024-09-24 VITALS — BP 113/68 | HR 67 | Temp 100.0°F | Ht 70.0 in | Wt 198.4 lb

## 2024-09-24 DIAGNOSIS — F419 Anxiety disorder, unspecified: Secondary | ICD-10-CM

## 2024-09-24 DIAGNOSIS — R451 Restlessness and agitation: Secondary | ICD-10-CM

## 2024-09-24 MED ORDER — CLONAZEPAM 1 MG PO TABS: 1.0000 mg | ORAL_TABLET | Freq: Every day | ORAL | 2 refills | Status: AC

## 2024-09-24 NOTE — Assessment & Plan Note (Signed)
 Chronic.  Controlled with Klonopin .  UDS and controlled substance agreement up to date (May 2024)  Last refill was October.  Patient is aware that he will need to follow up every 3 months for refills.  PDMP checked.  Call sooner if concerns arise.

## 2024-09-24 NOTE — Progress Notes (Signed)
 BP 113/68   Pulse 67   Temp 100 F (37.8 C) (Oral) Comment: patient states he drank coffee before appointment  Ht 5' 10 (1.778 m)   Wt 198 lb 6.4 oz (90 kg)   SpO2 98%   BMI 28.47 kg/m    Subjective:    Patient ID: Antonio Frank, male    DOB: 07/17/1971, 53 y.o.   MRN: [id number]  HPI: Antonio Frank is a 53 y.o. male  Chief Complaint  Patient presents with   Anxiety   Hypertension   ANXIETY/STRESS GAD: 2, PhQ9: 5 Currently taking Klonopin  1-2x weekly which works well for him. He has slowed down with the amount he is taking the medication.  Acknowledges that he doesn't deal well with stress.  Last refill was October.   Tried Pristiq  based on Gene sight testing which did not work well for him.  Would like to continue with the Klonopin .  He only takes them when he needs them.   Duration:controlled Anxious mood: yes high and low at different times, aggravated with allergies Excessive worrying: yes Irritability: yes  Sweating: no Nausea: no Palpitations: No Hyperventilation: no Panic attacks: Yes but improved Agoraphobia: no  Obscessions/compulsions: no Depressed mood: yes  Patient has a spot on his ear.  He states it has been there for years.  Scabs over but never goes away.  He has not seen Dermatology yet but plans to once he gets insurance.   Relevant past medical, surgical, family and social history reviewed and updated as indicated. Interim medical history since our last visit reviewed. Allergies and medications reviewed and updated.  Review of Systems  Skin:        Scab on left ear  Psychiatric/Behavioral:  The patient is nervous/anxious.     Per HPI unless specifically indicated above     Objective:    BP 113/68   Pulse 67   Temp 100 F (37.8 C) (Oral) Comment: patient states he drank coffee before appointment  Ht 5' 10 (1.778 m)   Wt 198 lb 6.4 oz (90 kg)   SpO2 98%   BMI 28.47 kg/m   Wt Readings from Last 3 Encounters:  09/24/24 198 lb 6.4 oz  (90 kg)  06/21/24 195 lb (88.5 kg)  03/20/24 195 lb 3.2 oz (88.5 kg)    Physical Exam Vitals and nursing note reviewed.  Constitutional:      General: He is not in acute distress.    Appearance: Normal appearance. He is not ill-appearing, toxic-appearing or diaphoretic.  HENT:     Head: Normocephalic.      Right Ear: External ear normal.     Left Ear: External ear normal.     Nose: Nose normal. No congestion or rhinorrhea.     Mouth/Throat:     Mouth: Mucous membranes are moist.  Eyes:     General:        Right eye: No discharge.        Left eye: No discharge.     Extraocular Movements: Extraocular movements intact.     Conjunctiva/sclera: Conjunctivae normal.     Pupils: Pupils are equal, round, and reactive to light.  Cardiovascular:     Rate and Rhythm: Normal rate and regular rhythm.     Heart sounds: No murmur heard. Pulmonary:     Effort: Pulmonary effort is normal. No respiratory distress.     Breath sounds: Normal breath sounds. No wheezing, rhonchi or rales.  Abdominal:  General: Abdomen is flat. Bowel sounds are normal.  Musculoskeletal:     Cervical back: Normal range of motion and neck supple.  Skin:    General: Skin is warm and dry.     Capillary Refill: Capillary refill takes less than 2 seconds.  Neurological:     General: No focal deficit present.     Mental Status: He is alert and oriented to person, place, and time.  Psychiatric:        Mood and Affect: Mood normal.        Behavior: Behavior normal.        Thought Content: Thought content normal.        Judgment: Judgment normal.     Results for orders placed or performed in visit on 11/14/23  Urinalysis, Routine w reflex microscopic   Collection Time: 11/14/23 10:52 AM  Result Value Ref Range   Specific Gravity, UA 1.025 1.005 - 1.030   pH, UA 6.0 5.0 - 7.5   Color, UA Yellow Yellow   Appearance Ur Clear Clear   Leukocytes,UA Negative Negative   Protein,UA Negative Negative/Trace    Glucose, UA Negative Negative   Ketones, UA Negative Negative   RBC, UA Negative Negative   Bilirubin, UA Negative Negative   Urobilinogen, Ur 0.2 0.2 - 1.0 mg/dL   Nitrite, UA Negative Negative   Microscopic Examination Comment   TSH   Collection Time: 11/14/23 11:07 AM  Result Value Ref Range   TSH 0.928 0.450 - 4.500 uIU/mL  PSA   Collection Time: 11/14/23 11:07 AM  Result Value Ref Range   Prostate Specific Ag, Serum 0.5 0.0 - 4.0 ng/mL  Lipid panel   Collection Time: 11/14/23 11:07 AM  Result Value Ref Range   Cholesterol, Total 199 100 - 199 mg/dL   Triglycerides 66 0 - 149 mg/dL   HDL 66 >60 mg/dL   VLDL Cholesterol Cal 12 5 - 40 mg/dL   LDL Chol Calc (NIH) 878 (H) 0 - 99 mg/dL   Chol/HDL Ratio 3.0 0.0 - 5.0 ratio  CBC with Differential/Platelet   Collection Time: 11/14/23 11:07 AM  Result Value Ref Range   WBC 8.5 3.4 - 10.8 x10E3/uL   RBC 5.12 4.14 - 5.80 x10E6/uL   Hemoglobin 15.7 13.0 - 17.7 g/dL   Hematocrit 52.1 62.4 - 51.0 %   MCV 93 79 - 97 fL   MCH 30.7 26.6 - 33.0 pg   MCHC 32.8 31.5 - 35.7 g/dL   RDW 87.5 88.3 - 84.5 %   Platelets 349 150 - 450 x10E3/uL   Neutrophils 69 Not Estab. %   Lymphs 22 Not Estab. %   Monocytes 6 Not Estab. %   Eos 2 Not Estab. %   Basos 1 Not Estab. %   Neutrophils Absolute 5.8 1.4 - 7.0 x10E3/uL   Lymphocytes Absolute 1.9 0.7 - 3.1 x10E3/uL   Monocytes Absolute 0.5 0.1 - 0.9 x10E3/uL   EOS (ABSOLUTE) 0.2 0.0 - 0.4 x10E3/uL   Basophils Absolute 0.1 0.0 - 0.2 x10E3/uL   Immature Granulocytes 0 Not Estab. %   Immature Grans (Abs) 0.0 0.0 - 0.1 x10E3/uL  Comprehensive metabolic panel   Collection Time: 11/14/23 11:07 AM  Result Value Ref Range   Glucose 96 70 - 99 mg/dL   BUN 25 (H) 6 - 24 mg/dL   Creatinine, Ser 8.86 0.76 - 1.27 mg/dL   eGFR 78 >40 fO/fpw/8.26   BUN/Creatinine Ratio 22 (H) 9 - 20   Sodium 139  134 - 144 mmol/L   Potassium 4.7 3.5 - 5.2 mmol/L   Chloride 102 96 - 106 mmol/L   CO2 23 20 - 29 mmol/L    Calcium 9.9 8.7 - 10.2 mg/dL   Total Protein 6.9 6.0 - 8.5 g/dL   Albumin 4.7 3.8 - 4.9 g/dL   Globulin, Total 2.2 1.5 - 4.5 g/dL   Bilirubin Total 0.8 0.0 - 1.2 mg/dL   Alkaline Phosphatase 75 44 - 121 IU/L   AST 18 0 - 40 IU/L   ALT 21 0 - 44 IU/L  Vitamin D  (25 hydroxy)   Collection Time: 11/14/23 11:07 AM  Result Value Ref Range   Vit D, 25-Hydroxy 23.1 (L) 30.0 - 100.0 ng/mL  Hepatitis C Antibody   Collection Time: 11/14/23 11:07 AM  Result Value Ref Range   Hep C Virus Ab Non Reactive Non Reactive      Assessment & Plan:   Problem List Items Addressed This Visit       Other   Anxiety with agitation - Primary   Chronic.  Controlled with Klonopin .  UDS and controlled substance agreement up to date (May 2024)  Last refill was October.  Patient is aware that he will need to follow up every 3 months for refills.  PDMP checked.  Call sooner if concerns arise.          Follow up plan: Return in about 3 months (around 12/25/2024) for HTN, HLD, DM2 FU.

## 2024-12-27 ENCOUNTER — Ambulatory Visit: Payer: Self-pay | Admitting: Nurse Practitioner

## 2025-01-29 ENCOUNTER — Ambulatory Visit: Payer: Self-pay | Admitting: Nurse Practitioner

## 2025-04-17 ENCOUNTER — Ambulatory Visit: Payer: Self-pay | Admitting: Nurse Practitioner
